# Patient Record
Sex: Male | Born: 1975 | Race: White | Hispanic: No | Marital: Single | Smoking: Never smoker
Health system: Southern US, Community
[De-identification: ages and names within clinical notes are randomized; demographics above are authoritative.]

## PROBLEM LIST (undated history)

## (undated) DIAGNOSIS — F419 Anxiety disorder, unspecified: Secondary | ICD-10-CM

## (undated) DIAGNOSIS — E669 Obesity, unspecified: Secondary | ICD-10-CM

## (undated) DIAGNOSIS — R002 Palpitations: Secondary | ICD-10-CM

## (undated) DIAGNOSIS — E559 Vitamin D deficiency, unspecified: Secondary | ICD-10-CM

## (undated) DIAGNOSIS — E785 Hyperlipidemia, unspecified: Secondary | ICD-10-CM

## (undated) DIAGNOSIS — D751 Secondary polycythemia: Secondary | ICD-10-CM

## (undated) HISTORY — DX: Secondary polycythemia: D75.1

## (undated) HISTORY — DX: Palpitations: R00.2

## (undated) HISTORY — DX: Hyperlipidemia, unspecified: E78.5

## (undated) HISTORY — DX: Vitamin D deficiency, unspecified: E55.9

## (undated) HISTORY — DX: Obesity, unspecified: E66.9

---

## 2001-01-09 ENCOUNTER — Emergency Department (HOSPITAL_COMMUNITY): Admission: EM | Admit: 2001-01-09 | Discharge: 2001-01-09 | Payer: Self-pay | Admitting: Emergency Medicine

## 2001-01-09 ENCOUNTER — Encounter: Payer: Self-pay | Admitting: Emergency Medicine

## 2010-12-31 ENCOUNTER — Emergency Department (HOSPITAL_COMMUNITY): Payer: Self-pay

## 2010-12-31 ENCOUNTER — Emergency Department (HOSPITAL_COMMUNITY)
Admission: EM | Admit: 2010-12-31 | Discharge: 2010-12-31 | Disposition: A | Discharge status: Home / Self Care | Payer: Self-pay | Attending: Emergency Medicine | Admitting: Emergency Medicine

## 2010-12-31 DIAGNOSIS — R5383 Other fatigue: Secondary | ICD-10-CM | POA: Insufficient documentation

## 2010-12-31 DIAGNOSIS — F411 Generalized anxiety disorder: Secondary | ICD-10-CM | POA: Insufficient documentation

## 2010-12-31 DIAGNOSIS — R209 Unspecified disturbances of skin sensation: Secondary | ICD-10-CM | POA: Insufficient documentation

## 2010-12-31 DIAGNOSIS — R5381 Other malaise: Secondary | ICD-10-CM | POA: Insufficient documentation

## 2010-12-31 LAB — GLUCOSE, CAPILLARY: Glucose-Capillary: 83 mg/dL (ref 70–99)

## 2011-07-28 DIAGNOSIS — D45 Polycythemia vera: Secondary | ICD-10-CM | POA: Insufficient documentation

## 2011-07-28 DIAGNOSIS — R5381 Other malaise: Secondary | ICD-10-CM | POA: Insufficient documentation

## 2011-07-28 DIAGNOSIS — R209 Unspecified disturbances of skin sensation: Secondary | ICD-10-CM | POA: Insufficient documentation

## 2011-07-28 DIAGNOSIS — R079 Chest pain, unspecified: Secondary | ICD-10-CM | POA: Insufficient documentation

## 2011-07-28 DIAGNOSIS — M79609 Pain in unspecified limb: Secondary | ICD-10-CM | POA: Insufficient documentation

## 2011-07-28 DIAGNOSIS — R6884 Jaw pain: Secondary | ICD-10-CM | POA: Insufficient documentation

## 2011-07-28 DIAGNOSIS — J45909 Unspecified asthma, uncomplicated: Secondary | ICD-10-CM | POA: Insufficient documentation

## 2011-07-28 DIAGNOSIS — M542 Cervicalgia: Secondary | ICD-10-CM | POA: Insufficient documentation

## 2011-07-29 ENCOUNTER — Encounter (HOSPITAL_COMMUNITY): Payer: Self-pay | Admitting: Family Medicine

## 2011-07-29 ENCOUNTER — Emergency Department (HOSPITAL_COMMUNITY): Payer: Self-pay

## 2011-07-29 ENCOUNTER — Emergency Department (HOSPITAL_COMMUNITY)
Admission: EM | Admit: 2011-07-29 | Discharge: 2011-07-29 | Disposition: A | Discharge status: Home / Self Care | Payer: Self-pay | Attending: Emergency Medicine | Admitting: Emergency Medicine

## 2011-07-29 DIAGNOSIS — D751 Secondary polycythemia: Secondary | ICD-10-CM

## 2011-07-29 HISTORY — DX: Anxiety disorder, unspecified: F41.9

## 2011-07-29 LAB — DIFFERENTIAL
Basophils Absolute: 0 10*3/uL (ref 0.0–0.1)
Basophils Relative: 0 % (ref 0–1)
Lymphocytes Relative: 27 % (ref 12–46)
Monocytes Absolute: 0.6 10*3/uL (ref 0.1–1.0)
Neutro Abs: 5.3 10*3/uL (ref 1.7–7.7)
Neutrophils Relative %: 65 % (ref 43–77)

## 2011-07-29 LAB — CBC
HCT: 49 % (ref 39.0–52.0)
Hemoglobin: 17.4 g/dL — ABNORMAL HIGH (ref 13.0–17.0)
RDW: 12.7 % (ref 11.5–15.5)
WBC: 8.3 10*3/uL (ref 4.0–10.5)

## 2011-07-29 LAB — POCT I-STAT, CHEM 8
BUN: 13 mg/dL (ref 6–23)
Calcium, Ion: 1.17 mmol/L (ref 1.12–1.32)
Chloride: 107 mEq/L (ref 96–112)
HCT: 52 % (ref 39.0–52.0)
Potassium: 4 mEq/L (ref 3.5–5.1)

## 2011-07-29 LAB — POCT I-STAT TROPONIN I: Troponin i, poc: 0 ng/mL (ref 0.00–0.08)

## 2011-07-29 NOTE — Discharge Instructions (Signed)
Chest Pain (Nonspecific) It is often hard to give a specific diagnosis for the cause of chest pain. There is always a chance that your pain could be related to something serious, such as a heart attack or a blood clot in the lungs. You need to follow up with your caregiver for further evaluation. CAUSES   Heartburn.   Pneumonia or bronchitis.   Anxiety or stress.   Inflammation around your heart (pericarditis) or lung (pleuritis or pleurisy).   A blood clot in the lung.   A collapsed lung (pneumothorax). It can develop suddenly on its own (spontaneous pneumothorax) or from injury (trauma) to the chest.   Shingles infection (herpes zoster virus).  The chest wall is composed of bones, muscles, and cartilage. Any of these can be the source of the pain.  The bones can be bruised by injury.   The muscles or cartilage can be strained by coughing or overwork.   The cartilage can be affected by inflammation and become sore (costochondritis).  DIAGNOSIS  Lab tests or other studies, such as X-rays, electrocardiography, stress testing, or cardiac imaging, may be needed to find the cause of your pain.  TREATMENT   Treatment depends on what may be causing your chest pain. Treatment may include:   Acid blockers for heartburn.   Anti-inflammatory medicine.   Pain medicine for inflammatory conditions.   Antibiotics if an infection is present.   You may be advised to change lifestyle habits. This includes stopping smoking and avoiding alcohol, caffeine, and chocolate.   You may be advised to keep your head raised (elevated) when sleeping. This reduces the chance of acid going backward from your stomach into your esophagus.   Most of the time, nonspecific chest pain will improve within 2 to 3 days with rest and mild pain medicine.  HOME CARE INSTRUCTIONS   If antibiotics were prescribed, take your antibiotics as directed. Finish them even if you start to feel better.   For the next few  days, avoid physical activities that bring on chest pain. Continue physical activities as directed.   Do not smoke.   Avoid drinking alcohol.   Only take over-the-counter or prescription medicine for pain, discomfort, or fever as directed by your caregiver.   Follow your caregiver's suggestions for further testing if your chest pain does not go away.   Keep any follow-up appointments you made. If you do not go to an appointment, you could develop lasting (chronic) problems with pain. If there is any problem keeping an appointment, you must call to reschedule.  SEEK MEDICAL CARE IF:   You think you are having problems from the medicine you are taking. Read your medicine instructions carefully.   Your chest pain does not go away, even after treatment.   You develop a rash with blisters on your chest.  SEEK IMMEDIATE MEDICAL CARE IF:   You have increased chest pain or pain that spreads to your arm, neck, jaw, back, or abdomen.   You develop shortness of breath, an increasing cough, or you are coughing up blood.   You have severe back or abdominal pain, feel nauseous, or vomit.   You develop severe weakness, fainting, or chills.   You have a fever.  THIS IS AN EMERGENCY. Do not wait to see if the pain will go away. Get medical help at once. Call your local emergency services (911 in [place] Do not drive yourself to the hospital. MAKE SURE YOU:   Understand these instructions.     Will watch your condition.   Will get help right away if you are not doing well or get worse.  Document Released: 12/15/2004 Document Revised: 02/24/2011 Document Reviewed: 10/11/2007 [employer] Patient Information 2012 [employer]. 

## 2011-07-29 NOTE — ED Notes (Signed)
Pt resting comfortably and friend is at bedside no acute distress.  He is worried about his pain butit is better and he is calmer
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
## 2011-07-29 NOTE — ED Notes (Signed)
Patient states that he has had chest pain off and on "for weeks." Reports left arm "feels weird. My tongue is numb, feels weird." Also reports pain into left neck.

## 2020-05-30 ENCOUNTER — Other Ambulatory Visit: Payer: Self-pay

## 2020-05-30 ENCOUNTER — Emergency Department (HOSPITAL_COMMUNITY): Payer: Medicaid Other

## 2020-05-30 ENCOUNTER — Emergency Department (HOSPITAL_COMMUNITY)
Admission: EM | Admit: 2020-05-30 | Discharge: 2020-05-30 | Disposition: A | Discharge status: Home / Self Care | Payer: Medicaid Other | Attending: Emergency Medicine | Admitting: Emergency Medicine

## 2020-05-30 ENCOUNTER — Encounter (HOSPITAL_COMMUNITY): Payer: Self-pay | Admitting: Emergency Medicine

## 2020-05-30 DIAGNOSIS — R1013 Epigastric pain: Secondary | ICD-10-CM | POA: Insufficient documentation

## 2020-05-30 DIAGNOSIS — F419 Anxiety disorder, unspecified: Secondary | ICD-10-CM | POA: Insufficient documentation

## 2020-05-30 DIAGNOSIS — R000 Tachycardia, unspecified: Secondary | ICD-10-CM

## 2020-05-30 DIAGNOSIS — Z7982 Long term (current) use of aspirin: Secondary | ICD-10-CM | POA: Insufficient documentation

## 2020-05-30 DIAGNOSIS — H538 Other visual disturbances: Secondary | ICD-10-CM | POA: Insufficient documentation

## 2020-05-30 DIAGNOSIS — R079 Chest pain, unspecified: Secondary | ICD-10-CM | POA: Insufficient documentation

## 2020-05-30 DIAGNOSIS — R42 Dizziness and giddiness: Secondary | ICD-10-CM

## 2020-05-30 DIAGNOSIS — R200 Anesthesia of skin: Secondary | ICD-10-CM | POA: Insufficient documentation

## 2020-05-30 DIAGNOSIS — J45909 Unspecified asthma, uncomplicated: Secondary | ICD-10-CM | POA: Insufficient documentation

## 2020-05-30 LAB — CBC WITH DIFFERENTIAL/PLATELET
Abs Immature Granulocytes: 0.05 10*3/uL (ref 0.00–0.07)
Basophils Absolute: 0 10*3/uL (ref 0.0–0.1)
Basophils Relative: 0 %
Eosinophils Absolute: 0 10*3/uL (ref 0.0–0.5)
Eosinophils Relative: 0 %
HCT: 52.6 % — ABNORMAL HIGH (ref 39.0–52.0)
Hemoglobin: 18.4 g/dL — ABNORMAL HIGH (ref 13.0–17.0)
Immature Granulocytes: 0 %
Lymphocytes Relative: 17 %
Lymphs Abs: 2.2 10*3/uL (ref 0.7–4.0)
MCH: 30 pg (ref 26.0–34.0)
MCHC: 35 g/dL (ref 30.0–36.0)
MCV: 85.8 fL (ref 80.0–100.0)
Monocytes Absolute: 0.6 10*3/uL (ref 0.1–1.0)
Monocytes Relative: 5 %
Neutro Abs: 10.6 10*3/uL — ABNORMAL HIGH (ref 1.7–7.7)
Neutrophils Relative %: 78 %
Platelets: 269 10*3/uL (ref 150–400)
RBC: 6.13 MIL/uL — ABNORMAL HIGH (ref 4.22–5.81)
RDW: 12.2 % (ref 11.5–15.5)
WBC: 13.5 10*3/uL — ABNORMAL HIGH (ref 4.0–10.5)
nRBC: 0 % (ref 0.0–0.2)

## 2020-05-30 LAB — T4, FREE: Free T4: 0.81 ng/dL (ref 0.61–1.12)

## 2020-05-30 LAB — TSH: TSH: 1.09 u[IU]/mL (ref 0.350–4.500)

## 2020-05-30 LAB — COMPREHENSIVE METABOLIC PANEL
ALT: 30 U/L (ref 0–44)
AST: 30 U/L (ref 15–41)
Albumin: 4.2 g/dL (ref 3.5–5.0)
Alkaline Phosphatase: 73 U/L (ref 38–126)
Anion gap: 16 — ABNORMAL HIGH (ref 5–15)
BUN: 11 mg/dL (ref 6–20)
CO2: 15 mmol/L — ABNORMAL LOW (ref 22–32)
Calcium: 9.6 mg/dL (ref 8.9–10.3)
Chloride: 105 mmol/L (ref 98–111)
Creatinine, Ser: 1.05 mg/dL (ref 0.61–1.24)
GFR, Estimated: >60 mL/min (ref >60)
Glucose, Bld: 171 mg/dL — ABNORMAL HIGH (ref 70–99)
Potassium: 3.5 mmol/L (ref 3.5–5.1)
Sodium: 136 mmol/L (ref 135–145)
Total Bilirubin: 0.6 mg/dL (ref 0.3–1.2)
Total Protein: 7.1 g/dL (ref 6.5–8.1)

## 2020-05-30 LAB — TROPONIN I (HIGH SENSITIVITY)
Troponin I (High Sensitivity): 3 ng/L (ref <18)
Troponin I (High Sensitivity): 7 ng/L (ref <18)

## 2020-05-30 LAB — D-DIMER, QUANTITATIVE: D-Dimer, Quant: <0.27 ug/mL-FEU (ref 0.00–0.50)

## 2020-05-30 MED ORDER — MECLIZINE HCL 25 MG PO TABS
12.5000 mg | ORAL_TABLET | Freq: Once | ORAL | Status: AC
  Administered 2020-05-30: 12.5 mg via ORAL
  Filled 2020-05-30: qty 1

## 2020-05-30 MED ORDER — LORAZEPAM 0.5 MG PO TABS
0.5000 mg | ORAL_TABLET | Freq: Once | ORAL | Status: AC
  Administered 2020-05-30: 0.5 mg via ORAL
  Filled 2020-05-30: qty 1

## 2020-05-30 MED ORDER — MECLIZINE HCL 25 MG PO TABS: 25.0000 mg | ORAL_TABLET | Freq: Three times a day (TID) | ORAL | 0 refills | Status: DC | PRN

## 2020-05-30 NOTE — ED Provider Notes (Signed)
[facility] DEPT Provider Note   CSN: [id number] Arrival date & time: 05/30/20  1140     History No chief complaint on file.   Cory Frazier is a 45 y.o. male.  HPI 45 year old male with a history of anxiety and asthma presents to the ER with multiple complaints.  He states that he has been having dizziness for "quite a while", and he thinks that he may have vertigo.  He has had a longstanding issue with anxiety, states that his grandmother passed away recently and thinks that this may be contributing to his symptoms.  He states that this morning he got up when he was dizzy while he was laying in bed, got up to drink some orange juice and started to feel some right-sided arm tingling and chest pain.  He was afraid that he was having a heart attack or stroke.  He denies any word slurring, though does endorse occasional blurry vision.  He denies any headache.  He also complains of longstanding epigastric pain, states that he has a very poor diet.  He does not follow with a primary care doctor, states he has not seen a doctor since 2008.  He states that he also thinks that his blood pressures may be elevated which could be causing his symptoms as well.  When asked to identify his most significant concerns, he reports that he was afraid that he was either having a heart attack or stroke.  He does not take any anxiety medicines.  He endorses some chest pain currently, though again does not know if this is due to his anxiety or other causes.  He denies any nausea, vomiting, syncope, back pain or dysuria, hematuria    Past Medical History:  Diagnosis Date   Anxiety    Asthma     There are no problems to display for this patient.   History reviewed. No pertinent surgical history.     No family history on file.  Social History   Tobacco Use   Smoking status: Never Smoker  Substance Use Topics   Alcohol use: Yes    Comment: Occasional    Drug use: No     Home Medications Prior to Admission medications   Medication Sig Start Date End Date Taking? Authorizing Provider  meclizine (ANTIVERT) 25 MG tablet Take 1 tablet (25 mg total) by mouth 3 (three) times daily as needed for dizziness. Start taking 1/2 pill (12.5mg ) up to 3 times daily, increase to taking 25mg  if needed 05/30/20  Yes Aracelly Tencza, Pamelia Hoit, PA-C  ALPRAZolam Duanne Moron) 0.5 MG tablet Take 0.25 mg by mouth 2 (two) times daily.    [provider]  aspirin 81 MG chewable tablet Chew 81 mg by mouth daily.    [provider]    Allergies    Codeine and Claritin [loratadine]  Review of Systems   Review of Systems  Constitutional: Negative for chills and fever.  HENT: Negative for ear pain and sore throat.   Eyes: Negative for pain and visual disturbance.  Respiratory: Positive for shortness of breath. Negative for cough.   Cardiovascular: Positive for chest pain and palpitations.  Gastrointestinal: Positive for abdominal pain. Negative for vomiting.  Genitourinary: Negative for dysuria and hematuria.  Musculoskeletal: Negative for arthralgias and back pain.  Skin: Negative for color change and rash.  Neurological: Positive for dizziness. Negative for seizures, syncope, facial asymmetry, weakness, light-headedness and headaches.  Psychiatric/Behavioral: The patient is nervous/anxious.   All other systems reviewed  and are negative.   Physical Exam Updated Vital Signs BP (!) 136/101    Pulse (!) 106    Temp 97.6 F (36.4 C) (Oral)    Resp 18    Ht 5\' 7"  (1.702 m)    Wt 111.1 kg    SpO2 91%    BMI 38.37 kg/m   Physical Exam Vitals and nursing note reviewed.  Constitutional:      General: He is not in acute distress.    Appearance: He is well-developed. He is not toxic-appearing or diaphoretic.  HENT:     Head: Normocephalic and atraumatic.     Mouth/Throat:     Pharynx: Oropharynx is clear.  Eyes:     Conjunctiva/sclera: Conjunctivae normal.  Cardiovascular:      Rate and Rhythm: Regular rhythm. Tachycardia present.     Pulses: Normal pulses.     Heart sounds: No murmur heard.   Pulmonary:     Effort: Pulmonary effort is normal. No respiratory distress.     Breath sounds: Normal breath sounds.  Abdominal:     General: Abdomen is flat.     Palpations: Abdomen is soft.     Tenderness: There is no abdominal tenderness. There is no right CVA tenderness or left CVA tenderness.  Musculoskeletal:        General: Normal range of motion.     Cervical back: Neck supple.     Right lower leg: No edema.     Left lower leg: No edema.  Skin:    General: Skin is warm and dry.  Neurological:     General: No focal deficit present.     Mental Status: He is alert and oriented to person, place, and time.     Sensory: No sensory deficit.     Motor: No weakness.     Comments: Mental Status:  Alert, thought content appropriate, able to give a coherent history. Speech fluent without evidence of aphasia. Able to follow 2 step commands without difficulty.  Cranial Nerves:  II: Peripheral visual fields grossly normal, pupils equal, round, reactive to light III,IV, VI: ptosis not present, extra-ocular motions intact bilaterally  V,VII: smile symmetric, facial light touch sensation equal VIII: hearing grossly normal to voice  X: uvula elevates symmetrically  XI: bilateral shoulder shrug symmetric and strong XII: midline tongue extension without fassiculations Motor:  Normal tone. 5/5 strength of BUE and BLE major muscle groups including strong and equal grip strength and dorsiflexion/plantar flexion Sensory: light touch normal in all extremities. Cerebellar: normal finger-to-nose with bilateral upper extremities, Romberg sign absent Gait: normal gait and balance. Able to walk on toes and heels with ease.       ED Results / Procedures / Treatments   Labs (all labs ordered are listed, but only abnormal results are displayed) Labs Reviewed  CBC WITH  DIFFERENTIAL/PLATELET - Abnormal; Notable for the following components:      Result Value   WBC 13.5 (*)    RBC 6.13 (*)    Hemoglobin 18.4 (*)    HCT 52.6 (*)    Neutro Abs 10.6 (*)    All other components within normal limits  COMPREHENSIVE METABOLIC PANEL - Abnormal; Notable for the following components:   CO2 15 (*)    Glucose, Bld 171 (*)    Anion gap 16 (*)    All other components within normal limits  TSH  T4, FREE  D-DIMER, QUANTITATIVE  TROPONIN I (HIGH SENSITIVITY)  TROPONIN I (HIGH SENSITIVITY)  EKG EKG Interpretation  Date/Time:  Saturday May 30 2020 11:58:07 EST Ventricular Rate:  112 PR Interval:    QRS Duration: 89 QT Interval:  340 QTC Calculation: 465 R Axis:   -19 Text Interpretation: Sinus tachycardia Borderline left axis deviation Abnormal R-wave progression, late transition Minimal ST depression, lateral leads 12 Lead; Mason-Likar No significant change since last tracing Confirmed by Dorie Rank [phone]) on 05/30/2020 12:00:23 PM   Radiology CT Head Wo Contrast  Result Date: 05/30/2020 CLINICAL DATA:  Dizziness, hypertension EXAM: CT HEAD WITHOUT CONTRAST TECHNIQUE: Contiguous axial images were obtained from the base of the skull through the vertex without intravenous contrast. COMPARISON:  None. FINDINGS: Brain: No evidence of acute infarction, hemorrhage, hydrocephalus, extra-axial collection or mass lesion/mass effect. Vascular: No hyperdense vessel or unexpected calcification. Skull: Normal. Negative for fracture or focal lesion. Sinuses/Orbits: No acute finding. Other: None. IMPRESSION: No acute intracranial pathology. No non-contrast CT findings to explain dizziness. Electronically Signed   By: Eddie Candle M.D.   On: 05/30/2020 12:41   DG Chest Portable 1 View  Result Date: 05/30/2020 CLINICAL DATA:  Chest pain EXAM: PORTABLE CHEST 1 VIEW COMPARISON:  07/29/2011 FINDINGS: The heart size and mediastinal contours are within normal limits. Mildly  prominent perihilar and bibasilar interstitial markings. No focal airspace consolidation. No pleural effusion or pneumothorax. The visualized skeletal structures are unremarkable. IMPRESSION: Mildly prominent perihilar and bibasilar interstitial markings, nonspecific, but could reflect bronchitic type lung changes versus mild edema or atypical/viral infection. No focal airspace consolidation. Electronically Signed   By: Davina Poke D.O.   On: 05/30/2020 12:51    Procedures Procedures   Medications Ordered in ED Medications  LORazepam (ATIVAN) tablet 0.5 mg (0.5 mg Oral Given 05/30/20 1243)  LORazepam (ATIVAN) tablet 0.5 mg (0.5 mg Oral Given 05/30/20 1555)  meclizine (ANTIVERT) tablet 12.5 mg (12.5 mg Oral Given 05/30/20 1555)    ED Course  I have reviewed the triage vital signs and the nursing notes.  Pertinent labs & imaging results that were available during my care of the patient were reviewed by me and considered in my medical decision making (see chart for details).    MDM Rules/Calculators/A&P                          45 year old male with multiple complaints, most notably with dizziness, right arm tingling, chest pain.  Dizziness has been ongoing for quite some time.  On arrival, patient is very anxious appearing, tearful, hyperventilating.  Blood pressure on arrival was 154/113, tachycardic at 125, tachypneic at 22.  However his blood pressure steadily decreased throughout his ED stay, as well as his heart rate.  Physical exam benign, lung sounds clear, no focal neuro deficits noted.  Plan for basic labs, TSH, troponin, chest x-ray, CT of the head.  He was also given 0.5 mg of Ativan.  EKG reviewed by supervising physician Dr. Tomi Bamberger, overall faster rate but no significant changes.  CMP without any significant lecture light abnormalities, normal renal function, his CO2 is 15 and he does have a mildly elevated anion gap, I suspect this is secondary due to his hyperventilation and  anxiety which he presented with to the ER.  He has a nonspecific white count of 13.5, hemoglobin 18.4, chest x-ray with questionable scarring versus infection, but the patient has no respiratory symptoms, no cough, no fevers or chills.  Delta troponins are negative, TSH and free T4 are normal.  D-dimer is negative.  CT of the head without any acute abnormalities.  Patient was given 1 mg of Ativan in total, as well as meclizine.  He remained tachycardic throughout the ED course, however low suspicion for PE given negative D-dimer, no evidence of sepsis at this time.  Orthostatic vitals are overall reassuring.  I suspect significant anxiety as the patient still appears anxious while in the ER.  Suspect possible vertigo as a cause of his dizziness, as has been ongoing for multiple months.  Low suspicion for stroke at this time given persistent symptoms and no other focal neuro deficits.  Patient continued to complain of dizziness and anxiety, I did explain the overall reassuring work-up here today.  I did stress that he needs to follow-up with the PCP, will give contact information for [facility].  Will send home with meclizine.  We discussed return precautions.  He voiced understanding and is agreeable.  Stable for discharge at this time.   Case discussed with Dr. Joya Gaskins who is agreeable to the above plan and disposition   Final Clinical Impression(s) / ED Diagnoses Final diagnoses:  Sinus tachycardia  Dizziness    Rx / DC Orders ED Discharge Orders         Ordered    meclizine (ANTIVERT) 25 MG tablet  3 times daily PRN        05/30/20 1759           Garald Balding, PA-C 05/30/20 1803    Arnaldo Natal, MD 05/30/20 [phone]

## 2020-05-30 NOTE — ED Triage Notes (Signed)
BIB EMS, patient complains of anxiety, dizziness and high blood pressure intermittently over the past week. States he has been under a lot of stress since his grandmother passed away, is tachycardic and tachypnic in triage, is tearful as well.

## 2020-05-30 NOTE — ED Notes (Signed)
Patient was able to ambulate in room. Steady gait. C/o feeling "weird"

## 2020-05-30 NOTE — Discharge Instructions (Signed)
Your work-up today was overall reassuring.  You need to make sure you follow-up with [facility] which is a free clinic here in the area.  If you do have Medicaid, you may call them and ask for a list of approved primary care doctors.  Please return to the ER for any new or worsening symptoms.

## 2020-06-15 ENCOUNTER — Ambulatory Visit (INDEPENDENT_AMBULATORY_CARE_PROVIDER_SITE_OTHER): Payer: Self-pay | Admitting: Family

## 2020-06-15 ENCOUNTER — Encounter: Payer: Self-pay | Admitting: Family

## 2020-06-15 ENCOUNTER — Other Ambulatory Visit: Payer: Self-pay

## 2020-06-15 VITALS — BP 135/96 | HR 79 | Ht 66.97 in | Wt 232.2 lb

## 2020-06-15 DIAGNOSIS — F419 Anxiety disorder, unspecified: Secondary | ICD-10-CM

## 2020-06-15 DIAGNOSIS — R000 Tachycardia, unspecified: Secondary | ICD-10-CM

## 2020-06-15 DIAGNOSIS — F32A Depression, unspecified: Secondary | ICD-10-CM

## 2020-06-15 DIAGNOSIS — Z7689 Persons encountering health services in other specified circumstances: Secondary | ICD-10-CM

## 2020-06-15 DIAGNOSIS — R42 Dizziness and giddiness: Secondary | ICD-10-CM

## 2020-06-15 DIAGNOSIS — Z09 Encounter for follow-up examination after completed treatment for conditions other than malignant neoplasm: Secondary | ICD-10-CM
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
## 2020-06-15 NOTE — Patient Instructions (Addendum)
Return for annual physical examination, labs, and health maintenance. Arrive fasting meaning having had no food and/or nothing to drink for at least 8 hours prior to appointment.  Please take scheduled medications as normal.  Referral to ENT for dizziness.  Thank you for choosing Primary Care at [facility] for your medical home!    Cory Frazier was seen by Camillia Herter, NP today.   Cory Frazier's primary care provider is Ronnald Shedden Zachery Dauer, NP.   For the best care possible,  you should try to see Durene Fruits, NP whenever you come to clinic.   We look forward to seeing you again soon!  If you have any questions about your visit today,  please call us at [phone]  Or feel free to reach your provider via Power.    Hypertension, Adult Hypertension is another name for high blood pressure. High blood pressure forces your heart to work harder to pump blood. This can cause problems over time. There are two numbers in a blood pressure reading. There is a top number (systolic) over a bottom number (diastolic). It is best to have a blood pressure that is below 120/80. Healthy choices can help lower your blood pressure, or you may need medicine to help lower it. What are the causes? The cause of this condition is not known. Some conditions may be related to high blood pressure. What increases the risk?  Smoking.  Having type 2 diabetes mellitus, high cholesterol, or both.  Not getting enough exercise or physical activity.  Being overweight.  Having too much fat, sugar, calories, or salt (sodium) in your diet.  Drinking too much alcohol.  Having long-term (chronic) kidney disease.  Having a family history of high blood pressure.  Age. Risk increases with age.  Race. You may be at higher risk if you are African American.  Gender. Men are at higher risk than women before age 96. After age 30, women are at higher risk than men.  Having obstructive sleep  apnea.  Stress. What are the signs or symptoms?  High blood pressure may not cause symptoms. Very high blood pressure (hypertensive crisis) may cause: ? Headache. ? Feelings of worry or nervousness (anxiety). ? Shortness of breath. ? Nosebleed. ? A feeling of being sick to your stomach (nausea). ? Throwing up (vomiting). ? Changes in how you see. ? Very bad chest pain. ? Seizures. How is this treated?  This condition is treated by making healthy lifestyle changes, such as: ? Eating healthy foods. ? Exercising more. ? Drinking less alcohol.  Your health care provider may prescribe medicine if lifestyle changes are not enough to get your blood pressure under control, and if: ? Your top number is above 130. ? Your bottom number is above 80.  Your personal target blood pressure may vary. Follow these instructions at home: Eating and drinking  If told, follow the DASH eating plan. To follow this plan: ? Fill one half of your plate at each meal with fruits and vegetables. ? Fill one fourth of your plate at each meal with whole grains. Whole grains include whole-wheat pasta, brown rice, and whole-grain bread. ? Eat or drink low-fat dairy products, such as skim milk or low-fat yogurt. ? Fill one fourth of your plate at each meal with low-fat (lean) proteins. Low-fat proteins include fish, chicken without skin, eggs, beans, and tofu. ? Avoid fatty meat, cured and processed meat, or chicken with skin. ? Avoid pre-made or processed food.  Eat  less than 1,500 mg of salt each day.  Do not drink alcohol if: ? Your doctor tells you not to drink. ? You are pregnant, may be pregnant, or are planning to become pregnant.  If you drink alcohol: ? Limit how much you use to:  0-1 drink a day for women.  0-2 drinks a day for men. ? Be aware of how much alcohol is in your drink. In the [place] one drink equals one 12 oz bottle of beer (355 mL), one 5 oz glass of wine (148 mL), or one 1 oz  glass of hard liquor (44 mL).   Lifestyle  Work with your doctor to stay at a healthy weight or to lose weight. Ask your doctor what the best weight is for you.  Get at least 30 minutes of exercise most days of the week. This may include walking, swimming, or biking.  Get at least 30 minutes of exercise that strengthens your muscles (resistance exercise) at least 3 days a week. This may include lifting weights or doing Pilates.  Do not use any products that contain nicotine or tobacco, such as cigarettes, e-cigarettes, and chewing tobacco. If you need help quitting, ask your doctor.  Check your blood pressure at home as told by your doctor.  Keep all follow-up visits as told by your doctor. This is important.   Medicines  Take over-the-counter and prescription medicines only as told by your doctor. Follow directions carefully.  Do not skip doses of blood pressure medicine. The medicine does not work as well if you skip doses. Skipping doses also puts you at risk for problems.  Ask your doctor about side effects or reactions to medicines that you should watch for. Contact a doctor if you:  Think you are having a reaction to the medicine you are taking.  Have headaches that keep coming back (recurring).  Feel dizzy.  Have swelling in your ankles.  Have trouble with your vision. Get help right away if you:  Get a very bad headache.  Start to feel mixed up (confused).  Feel weak or numb.  Feel faint.  Have very bad pain in your: ? Chest. ? Belly (abdomen).  Throw up more than once.  Have trouble breathing. Summary  Hypertension is another name for high blood pressure.  High blood pressure forces your heart to work harder to pump blood.  For most people, a normal blood pressure is less than 120/80.  Making healthy choices can help lower blood pressure. If your blood pressure does not get lower with healthy choices, you may need to take medicine. This information  is not intended to replace advice given to you by your health care provider. Make sure you discuss any questions you have with your health care provider. Document Revised: 11/15/2017 Document Reviewed: 11/15/2017 Elsevier Patient Education  2021 [employer].

## 2020-06-15 NOTE — Progress Notes (Signed)
Establish care Anxiety  BP concerns

## 2020-06-28 NOTE — Progress Notes (Signed)
Patient ID: Cory Frazier, male    DOB: 13-Jan-1976  MRN: [id number]  CC: Annual Physical Examination   Subjective: Cory Frazier is a 45 y.o. male who presents for annual physical examination.  His concerns today include: requesting PSA screening.    Current Outpatient Medications on File Prior to Visit  Medication Sig Dispense Refill  . aspirin 81 MG chewable tablet Chew 81 mg by mouth daily.     No current facility-administered medications on file prior to visit.    Allergies  Allergen Reactions  . Codeine Other (See Comments)    unknown unknown  . Claritin [Loratadine] Anxiety    Social History   Socioeconomic History  . Marital status: Single    Spouse name: Not on file  . Number of children: Not on file  . Years of education: Not on file  . Highest education level: Not on file  Occupational History  . Not on file  Tobacco Use  . Smoking status: Never Smoker  . Smokeless tobacco: Never Used  Vaping Use  . Vaping Use: Never used  Substance and Sexual Activity  . Alcohol use: Yes    Comment: Occasional   . Drug use: No  . Sexual activity: Not Currently  Other Topics Concern  . Not on file  Social History Narrative  . Not on file   Social Determinants of Health   Financial Resource Strain: Not on file  Food Insecurity: Not on file  Transportation Needs: Not on file  Physical Activity: Not on file  Stress: Not on file  Social Connections: Not on file  Intimate Partner Violence: Not on file    Family History  Family history unknown: Yes    No past surgical history on file.  ROS: Review of Systems Negative except as stated above  PHYSICAL EXAM: Pulse 97   Ht 5' 6.97" (1.701 m)   Wt 228 lb (103.4 kg)   SpO2 97%   BMI 35.74 kg/m   Physical Exam Constitutional:      Appearance: He is obese.  HENT:     Head: Normocephalic and atraumatic.     Right Ear: Tympanic membrane, ear canal and external ear normal.     Left Ear: Tympanic  membrane, ear canal and external ear normal.     Nose: Nose normal.     Mouth/Throat:     Mouth: Mucous membranes are moist.     Pharynx: Oropharynx is clear.  Eyes:     Extraocular Movements: Extraocular movements intact.     Conjunctiva/sclera: Conjunctivae normal.     Pupils: Pupils are equal, round, and reactive to light.  Cardiovascular:     Rate and Rhythm: Regular rhythm.     Pulses: Normal pulses.     Heart sounds: Normal heart sounds.  Pulmonary:     Effort: Pulmonary effort is normal.     Breath sounds: Normal breath sounds.  Abdominal:     General: Bowel sounds are normal.     Palpations: Abdomen is soft.  Genitourinary:    Comments: Patient declined examination. Musculoskeletal:        General: Normal range of motion.     Cervical back: Normal range of motion and neck supple.  Skin:    General: Skin is warm and dry.     Capillary Refill: Capillary refill takes less than 2 seconds.  Neurological:     General: No focal deficit present.     Mental Status: He is alert and oriented to  person, place, and time.  Psychiatric:        Mood and Affect: Mood normal.        Behavior: Behavior normal.     ASSESSMENT AND PLAN: 1. Annual physical exam: - Counseled on 150 minutes of exercise per week as tolerated, healthy eating (including decreased daily intake of saturated fats, cholesterol, added sugars, sodium), STI prevention, and routine healthcare maintenance.  2. Encounter for screening for metabolic disorder: - CMP last obtained 05/30/2020.  3. Screening for iron deficiency anemia: - CBC to screen for anemia. - CBC  4. Macrocytic anemia: - Vitamin D and Vitamin B12 screening. - Vitamin D, 25-hydroxy - Vitamin B12  5. Diabetes mellitus screening: - Hemoglobin A1c to screen for pre-diabetes/diabetes. - Hemoglobin A1c  6. Screening cholesterol level: - Lipid panel to screen for high cholesterol.  - Lipid panel  7. Thyroid disorder screen: - TSH last  obtained 05/30/2020.  8. Need for hepatitis C screening test: - Hepatitis C antibody to screen for hepatitis C.  - Hepatitis C Antibody  9. Encounter for screening for HIV: - HIV antibody to screen for human immunodeficiency virus.  - HIV antibody (with reflex)  10. Colon cancer screening: - Referral to Gastroenterology for colon cancer screening by colonoscopy. - Ambulatory referral to Gastroenterology  11. Screening for prostate cancer: - Per patient request PSA screening. - PSA   Patient was given the opportunity to ask questions.  Patient verbalized understanding of the plan and was able to repeat key elements of the plan. Patient was given clear instructions to go to Emergency Department or return to medical center if symptoms don't improve, worsen, or new problems develop.The patient verbalized understanding.   Orders Placed This Encounter  Procedures  . Vitamin D, 25-hydroxy  . Vitamin B12  . Hepatitis C Antibody  . HIV antibody (with reflex)  . Lipid panel  . Hemoglobin A1c  . CBC  . PSA  . Ambulatory referral to Gastroenterology    Follow-up with primary provider as scheduled.  Camillia Herter, NP

## 2020-06-29 ENCOUNTER — Ambulatory Visit (INDEPENDENT_AMBULATORY_CARE_PROVIDER_SITE_OTHER): Payer: Self-pay | Admitting: Family

## 2020-06-29 ENCOUNTER — Other Ambulatory Visit: Payer: Self-pay

## 2020-06-29 VITALS — HR 97 | Ht 66.97 in | Wt 228.0 lb

## 2020-06-29 DIAGNOSIS — D539 Nutritional anemia, unspecified: Secondary | ICD-10-CM

## 2020-06-29 DIAGNOSIS — Z13220 Encounter for screening for lipoid disorders: Secondary | ICD-10-CM

## 2020-06-29 DIAGNOSIS — Z0000 Encounter for general adult medical examination without abnormal findings: Secondary | ICD-10-CM

## 2020-06-29 DIAGNOSIS — Z130 Encounter for screening for diseases of the blood and blood-forming organs and certain disorders involving the immune mechanism: Secondary | ICD-10-CM

## 2020-06-29 DIAGNOSIS — Z1159 Encounter for screening for other viral diseases: Secondary | ICD-10-CM

## 2020-06-29 DIAGNOSIS — Z114 Encounter for screening for human immunodeficiency virus [HIV]: Secondary | ICD-10-CM

## 2020-06-29 DIAGNOSIS — Z131 Encounter for screening for diabetes mellitus: Secondary | ICD-10-CM

## 2020-06-29 DIAGNOSIS — Z1211 Encounter for screening for malignant neoplasm of colon: Secondary | ICD-10-CM

## 2020-06-29 DIAGNOSIS — Z1329 Encounter for screening for other suspected endocrine disorder: Secondary | ICD-10-CM

## 2020-06-29 DIAGNOSIS — E559 Vitamin D deficiency, unspecified: Secondary | ICD-10-CM

## 2020-06-29 DIAGNOSIS — Z125 Encounter for screening for malignant neoplasm of prostate: Secondary | ICD-10-CM

## 2020-06-29 DIAGNOSIS — Z13228 Encounter for screening for other metabolic disorders: Secondary | ICD-10-CM

## 2020-06-29 NOTE — Progress Notes (Signed)
Physical

## 2020-06-29 NOTE — Patient Instructions (Signed)
Annual physical exam and labs today.   Follow-up with primary provider as scheduled.  Preventive Care 43-46 Years Old, Male Preventive care refers to lifestyle choices and visits with your health care provider that can promote health and wellness. This includes:  A yearly physical exam. This is also called an annual wellness visit.  Regular dental and eye exams.  Immunizations.  Screening for certain conditions.  Healthy lifestyle choices, such as: ? Eating a healthy diet. ? Getting regular exercise. ? Not using drugs or products that contain nicotine and tobacco. ? Limiting alcohol use. What can I expect for my preventive care visit? Physical exam Your health care provider will check your:  Height and weight. These may be used to calculate your BMI (body mass index). BMI is a measurement that tells if you are at a healthy weight.  Heart rate and blood pressure.  Body temperature.  Skin for abnormal spots. Counseling Your health care provider may ask you questions about your:  Past medical problems.  Family's medical history.  Alcohol, tobacco, and drug use.  Emotional well-being.  Home life and relationship well-being.  Sexual activity.  Diet, exercise, and sleep habits.  Work and work Statistician.  Access to firearms. What immunizations do I need? Vaccines are usually given at various ages, according to a schedule. Your health care provider will recommend vaccines for you based on your age, medical history, and lifestyle or other factors, such as travel or where you work.   What tests do I need? Blood tests  Lipid and cholesterol levels. These may be checked every 5 years, or more often if you are over 77 years old.  Hepatitis C test.  Hepatitis B test. Screening  Lung cancer screening. You may have this screening every year starting at age 4 if you have a 30-pack-year history of smoking and currently smoke or have quit within the past 15  years.  Prostate cancer screening. Recommendations will vary depending on your family history and other risks.  Genital exam to check for testicular cancer or hernias.  Colorectal cancer screening. ? All adults should have this screening starting at age 10 and continuing until age 66. ? Your health care provider may recommend screening at age 51 if you are at increased risk. ? You will have tests every 1-10 years, depending on your results and the type of screening test.  Diabetes screening. ? This is done by checking your blood sugar (glucose) after you have not eaten for a while (fasting). ? You may have this done every 1-3 years.  STD (sexually transmitted disease) testing, if you are at risk. Follow these instructions at home: Eating and drinking  Eat a diet that includes fresh fruits and vegetables, whole grains, lean protein, and low-fat dairy products.  Take vitamin and mineral supplements as recommended by your health care provider.  Do not drink alcohol if your health care provider tells you not to drink.  If you drink alcohol: ? Limit how much you have to 0-2 drinks a day. ? Be aware of how much alcohol is in your drink. In the [place] one drink equals one 12 oz bottle of beer (355 mL), one 5 oz glass of wine (148 mL), or one 1 oz glass of hard liquor (44 mL).   Lifestyle  Take daily care of your teeth and gums. Brush your teeth every morning and night with fluoride toothpaste. Floss one time each day.  Stay active. Exercise for at least 30 minutes  5 or more days each week.  Do not use any products that contain nicotine or tobacco, such as cigarettes, e-cigarettes, and chewing tobacco. If you need help quitting, ask your health care provider.  Do not use drugs.  If you are sexually active, practice safe sex. Use a condom or other form of protection to prevent STIs (sexually transmitted infections).  If told by your health care provider, take low-dose aspirin daily  starting at age 85.  Find healthy ways to cope with stress, such as: ? Meditation, yoga, or listening to music. ? Journaling. ? Talking to a trusted person. ? Spending time with friends and family. Safety  Always wear your seat belt while driving or riding in a vehicle.  Do not drive: ? If you have been drinking alcohol. Do not ride with someone who has been drinking. ? When you are tired or distracted. ? While texting.  Wear a helmet and other protective equipment during sports activities.  If you have firearms in your house, make sure you follow all gun safety procedures. What's next?  Go to your health care provider once a year for an annual wellness visit.  Ask your health care provider how often you should have your eyes and teeth checked.  Stay up to date on all vaccines. This information is not intended to replace advice given to you by your health care provider. Make sure you discuss any questions you have with your health care provider. Document Revised: 12/04/2018 Document Reviewed: 03/01/2018 Elsevier Patient Education  2021 [employer].

## 2020-06-30 DIAGNOSIS — E559 Vitamin D deficiency, unspecified: Secondary | ICD-10-CM | POA: Insufficient documentation

## 2020-06-30 LAB — CBC
Hematocrit: 53.2 % — ABNORMAL HIGH (ref 37.5–51.0)
Hemoglobin: 18.2 g/dL — ABNORMAL HIGH (ref 13.0–17.7)
MCH: 29.6 pg (ref 26.6–33.0)
MCHC: 34.2 g/dL (ref 31.5–35.7)
MCV: 87 fL (ref 79–97)
Platelets: 231 10*3/uL (ref 150–450)
RBC: 6.15 x10E6/uL — ABNORMAL HIGH (ref 4.14–5.80)
RDW: 12.5 % (ref 11.6–15.4)
WBC: 6.6 10*3/uL (ref 3.4–10.8)

## 2020-06-30 LAB — PSA: Prostate Specific Ag, Serum: 1 ng/mL (ref 0.0–4.0)

## 2020-06-30 LAB — HEMOGLOBIN A1C
Est. average glucose Bld gHb Est-mCnc: 100 mg/dL
Hgb A1c MFr Bld: 5.1 % (ref 4.8–5.6)

## 2020-06-30 LAB — LIPID PANEL
Chol/HDL Ratio: 5.1 ratio — ABNORMAL HIGH (ref 0.0–5.0)
Cholesterol, Total: 223 mg/dL — ABNORMAL HIGH (ref 100–199)
HDL: 44 mg/dL (ref >39)
LDL Chol Calc (NIH): 141 mg/dL — ABNORMAL HIGH (ref 0–99)
Triglycerides: 211 mg/dL — ABNORMAL HIGH (ref 0–149)
VLDL Cholesterol Cal: 38 mg/dL (ref 5–40)

## 2020-06-30 LAB — HIV ANTIBODY (ROUTINE TESTING W REFLEX): HIV Screen 4th Generation wRfx: NONREACTIVE

## 2020-06-30 LAB — HEPATITIS C ANTIBODY: Hep C Virus Ab: <0.1 s/co ratio (ref 0.0–0.9)

## 2020-06-30 LAB — VITAMIN D 25 HYDROXY (VIT D DEFICIENCY, FRACTURES): Vit D, 25-Hydroxy: 11.9 ng/mL — ABNORMAL LOW (ref 30.0–100.0)

## 2020-06-30 LAB — VITAMIN B12: Vitamin B-12: 410 pg/mL (ref 232–1245)

## 2020-06-30 MED ORDER — CHOLECALCIFEROL 1.25 MG (50000 UT) PO TABS: 1.0000 | ORAL_TABLET | ORAL | 0 refills | Status: DC

## 2020-06-30 NOTE — Progress Notes (Signed)
No diabetes.   Prostate function normal.   Hepatitis C negative.   HIV negative.   Vitamin B12 normal.   Vitamin D low. A Vitamin D pill prescribed and sent to patients pharmacy. Take 1 pill per week for the next 12 weeks. Encouraged to have rechecked in 12 weeks.   Cholesterol higher than expected. High cholesterol may increase risk of heart attack and/or stroke. Consider eating more fruits, vegetables, and lean baked meats such as chicken or fish. Moderate intensity exercise at least 150 minutes as tolerated per week may help as well.  Encouraged to have rechecked in 6 months or sooner if needed.   The following is for provider reference only: The 10-year ASCVD risk score is: 3.5%   Values used to calculate the score:     Age: 45 years     Sex: Male     Is Non-Hispanic African American: No     Diabetic: No     Tobacco smoker: No     Systolic Blood Pressure: 330 mmHg     Is BP treated: No     HDL Cholesterol: 44 mg/dL     Total Cholesterol: 223 mg/dL

## 2020-06-30 NOTE — Addendum Note (Signed)
Addended by: Camillia Herter on: 06/30/2020 07:33 AM   Modules accepted: Orders

## 2020-07-08 ENCOUNTER — Telehealth: Payer: Self-pay | Admitting: Family

## 2020-07-08 NOTE — Telephone Encounter (Signed)
Pt called because he has questions/ concerns about Cholecalciferol 1.25 MG (50000 UT) TABS [[id number]]. He would like to talk with a nurse regarding this med. His cell is [phone].

## 2020-07-22 ENCOUNTER — Telehealth: Payer: Self-pay | Admitting: Family

## 2020-07-22 NOTE — Telephone Encounter (Signed)
Pt calling stating he has anxiety issues and was wondering if something could be prescribed. Pt stated when he was at his visit he was not ready to start the medication and now is feeling like he needs to do something. Pt is asking for a low dose.     Pharmacy  [facility] [place]  [place], [place] [address]  Phone:  [phone] Fax:  [phone]  DEA #:  [id number]  \

## 2020-07-23 NOTE — Telephone Encounter (Signed)
Appt sch'd 07/27/20

## 2020-07-26 NOTE — Progress Notes (Signed)
Virtual Visit via Telephone Note  I connected with Cory Frazier, on 07/27/2020 at 10:36 AM by telephone due to the COVID-19 pandemic and verified that I am speaking with the correct person using two identifiers.  Due to current restrictions/limitations of in-office visits due to the COVID-19 pandemic, this scheduled clinical appointment was converted to a telehealth visit.   Consent: I discussed the limitations, risks, security and privacy concerns of performing an evaluation and management service by telephone and the availability of in person appointments. I also discussed with the patient that there may be a patient responsible charge related to this service. The patient expressed understanding and agreed to proceed.  Location of Patient: Home  Location of Provider: [facility] [place] participating in Telemedicine visit: Cory Frazier Durene Fruits, NP Elmon Else, CMA  History of Present Illness: Cory Frazier is a 45 year-old male who presents for anxiety and depression follow-up.  1. ANXIETY AND DEPRESSION FOLLOW-UP: 06/15/2020: - Stable. - Denies thoughts of self-harm, suicidal ideations, and homicidal ideations  - Declined pharmacological therapy during today's visit.  - [facility] referral for counseling services.  - Follow-up with primary provider as scheduled.   07/27/2020: Reports anxiety is worsening. Affecting relationships with family and friends. He would like to try medication to see if this helps. Not ready for counseling services as of yet. Denies thoughts of self-harm, suicidal ideations, and homicidal ideations.    2. GERD: Heartburn frequency: Yes, intermittently. Reports heartburn causes anxiety to worsen Antacid use frequency:  Using over-the-counter Pepcid. However, planning to switch to over-the-counter Nexium. Not ready for prescribed medication. Reports he read things online about Omeprazole. Aggravating  factors: caffeine Dysphagia: no Odynophagia:  no Hematemesis: no Blood in stool: no  3. VITAMIN D FOLLOW-UP: Patient reports not taking prescribed Vitamin D. Reports read online the side effects of prescribed Vitamin D at the dosage ordered. Would like to continue over-the-counter Vitamin D.   Past Medical History:  Diagnosis Date  . Anxiety   . Asthma    Allergies  Allergen Reactions  . Codeine Other (See Comments)    unknown unknown  . Claritin [Loratadine] Anxiety    Current Outpatient Medications on File Prior to Visit  Medication Sig Dispense Refill  . aspirin 81 MG chewable tablet Chew 81 mg by mouth daily.    . Cholecalciferol 1.25 MG (50000 UT) TABS Take 1 tablet by mouth once a week. 12 tablet 0   No current facility-administered medications on file prior to visit.    Observations/Objective: Alert and oriented x 3. Not in acute distress. Physical examination not completed as this is a telemedicine visit.  Assessment and Plan: 1. Anxiety and depression: - Stable.  - Denies thoughts of self-harm, suicidal ideations, and homicidal ideations.  - Begin Sertraline as prescribed.   Avoid driving or hazardous activity until you know how this medication will affect you. Your reactions could be impaired. Dizziness or fainting can cause falls, accidents, or severe injuries.  Common side effects include drowsiness, nausea, constipation, loss of appetite, dry mouth, increased sweating.  Call your provider if you have pounding heartbeats or fluttering in your chest, a light-headed feeling like you may pass out, easy bruising/unusal bleeding, vision change, difficult or painful urination, impotence/sexual problems, liver problems (right-sided upper stomach pain, itching, dark urine, yellowing of skin or eyes/jaundice, low levels of sodium in the body (headache, confusion, slurred speech, severe weakness, vomiting, loss of coordination, feeling unsteady), or manic episodes (racing  thoughts, increased energy, decreased need for sleep, risk-taking behavior, being agitated, talkative)  Seek medical attention immediately if you have symptoms of serotonin syndrome such as agitation, hallucinations, fever, sweating, shivering, fast heart rate, muscle stiffness, twitching, loss of coordination, nausea, vomiting, or diarrhea  Report any new or worsening symptoms to your provider, such as but not limited to: mood or behavior changes, anxiety, panic attacks, trouble sleeping, or if you feel impulsive, irritable, agitated, hostile, aggressive, restless, hyperactive (mentally or physically), more depressed, or have thoughts about suicide or hurting yourself - Follow-up with primary provider in 4 weeks or sooner if needed.  - sertraline (ZOLOFT) 25 MG tablet; Take 1 tablet (25 mg total) by mouth daily.  Dispense: 30 tablet; Refill: 0  2. Gastroesophageal reflux disease, unspecified whether esophagitis present: - Patient preference to continue over-the-counter acid reflux medication.   3. Vitamin D deficiency: - Patient preference to continue over-the-counter vitamin D medication.   Follow Up Instructions: Follow-up with primary provider in 4 weeks or sooner if needed.    Patient was given clear instructions to go to Emergency Department or return to medical center if symptoms don't improve, worsen, or new problems develop.The patient verbalized understanding.  I discussed the assessment and treatment plan with the patient. The patient was provided an opportunity to ask questions and all were answered. The patient agreed with the plan and demonstrated an understanding of the instructions.   The patient was advised to call back or seek an in-person evaluation if the symptoms worsen or if the condition fails to improve as anticipated.   I provided 20 minutes total of non-face-to-face time during this encounter.   Camillia Herter, NP  [facility] [place], [place] 07/27/2020, 10:36 AM

## 2020-07-27 ENCOUNTER — Telehealth (INDEPENDENT_AMBULATORY_CARE_PROVIDER_SITE_OTHER): Payer: Self-pay | Admitting: Family

## 2020-07-27 ENCOUNTER — Other Ambulatory Visit: Payer: Self-pay

## 2020-07-27 DIAGNOSIS — F32A Depression, unspecified: Secondary | ICD-10-CM

## 2020-07-27 DIAGNOSIS — E559 Vitamin D deficiency, unspecified: Secondary | ICD-10-CM

## 2020-07-27 DIAGNOSIS — K219 Gastro-esophageal reflux disease without esophagitis: Secondary | ICD-10-CM

## 2020-07-27 DIAGNOSIS — F419 Anxiety disorder, unspecified: Secondary | ICD-10-CM

## 2020-07-27 MED ORDER — SERTRALINE HCL 25 MG PO TABS: 25.0000 mg | ORAL_TABLET | Freq: Every day | ORAL | 0 refills | Status: DC

## 2020-07-27 NOTE — Progress Notes (Signed)
Anxiety and heartburn Pt states that when he has heartburn it makes him have panic attacks

## 2020-11-03 ENCOUNTER — Ambulatory Visit: Payer: Medicaid Other | Admitting: Family

## 2020-11-03 ENCOUNTER — Ambulatory Visit: Payer: Medicaid Other | Admitting: Family Medicine

## 2020-11-30 ENCOUNTER — Encounter: Payer: Self-pay | Admitting: Family Medicine

## 2020-11-30 ENCOUNTER — Other Ambulatory Visit: Payer: Self-pay

## 2020-11-30 ENCOUNTER — Ambulatory Visit (INDEPENDENT_AMBULATORY_CARE_PROVIDER_SITE_OTHER): Payer: Self-pay | Admitting: Family Medicine

## 2020-11-30 VITALS — BP 163/124 | HR 103 | Temp 98.4°F | Resp 16 | Ht 66.0 in | Wt 230.2 lb

## 2020-11-30 DIAGNOSIS — K219 Gastro-esophageal reflux disease without esophagitis: Secondary | ICD-10-CM

## 2020-11-30 DIAGNOSIS — F32A Depression, unspecified: Secondary | ICD-10-CM

## 2020-11-30 DIAGNOSIS — I10 Essential (primary) hypertension: Secondary | ICD-10-CM

## 2020-11-30 DIAGNOSIS — F419 Anxiety disorder, unspecified: Secondary | ICD-10-CM

## 2020-11-30 MED ORDER — METOPROLOL TARTRATE 25 MG PO TABS
25.0000 mg | ORAL_TABLET | Freq: Two times a day (BID) | ORAL | 0 refills | Status: DC
Start: 2020-11-30 — End: 2020-12-22

## 2020-11-30 NOTE — Progress Notes (Signed)
Established Patient Office Visit  Subjective:  Patient ID: Cory Frazier, male    DOB: 02-Mar-1976  Age: 45 y.o. MRN: [id number]  CC:  Chief Complaint  Patient presents with   Follow-up    Medication update    HPI Cory Frazier presents for follow-up of anxiety.  Patient reports that his anxiety persists however he has not started the medicine secondary to side effects he read on [employer].  He also reports intermittent palpitations.  He is very concerned about his blood pressure.  Past Medical History:  Diagnosis Date   Anxiety    Asthma     No past surgical history on file.  Family History  Family history unknown: Yes    Social History   Socioeconomic History   Marital status: Single    Spouse name: Not on file   Number of children: Not on file   Years of education: Not on file   Highest education level: Not on file  Occupational History   Not on file  Tobacco Use   Smoking status: Never   Smokeless tobacco: Never  Vaping Use   Vaping Use: Never used  Substance and Sexual Activity   Alcohol use: Yes    Comment: Occasional    Drug use: No   Sexual activity: Not Currently  Other Topics Concern   Not on file  Social History Narrative   Not on file   Social Determinants of Health   Financial Resource Strain: Not on file  Food Insecurity: Not on file  Transportation Needs: Not on file  Physical Activity: Not on file  Stress: Not on file  Social Connections: Not on file  Intimate Partner Violence: Not on file    ROS Review of Systems  Gastrointestinal:  Negative for abdominal pain.  Psychiatric/Behavioral:  Negative for self-injury, sleep disturbance and suicidal ideas. The patient is nervous/anxious.   All other systems reviewed and are negative.  Objective:   Today's Vitals: BP (!) 163/124 (BP Location: Right Arm, Patient Position: Sitting, Cuff Size: Large)   Pulse (!) 103   Temp 98.4 F (36.9 C) (Oral)   Resp 16   Ht '5\' 6"'$  (1.676 m)   Wt 230  lb 3.2 oz (104.4 kg)   SpO2 96%   BMI 37.16 kg/m   Physical Exam Vitals and nursing note reviewed.  Constitutional:      General: He is not in acute distress. Cardiovascular:     Rate and Rhythm: Normal rate and regular rhythm.  Pulmonary:     Effort: Pulmonary effort is normal.     Breath sounds: Normal breath sounds.  Abdominal:     Palpations: Abdomen is soft.     Tenderness: There is no abdominal tenderness.  Musculoskeletal:     Right lower leg: No edema.     Left lower leg: No edema.  Neurological:     General: No focal deficit present.     Mental Status: He is alert and oriented to person, place, and time.    Assessment & Plan:   1. Anxiety and depression Discussed compliance - patient to start zoloft 25 mg  2. Essential hypertension Elevated reading - Metoprolol 25 mg prescribed - will monitor  3. Gastroesophageal reflux disease without esophagitis Discussed dietary and activity options.     Outpatient Encounter Medications as of 11/30/2020  Medication Sig   aspirin 81 MG chewable tablet Chew 81 mg by mouth daily.   metoprolol tartrate (LOPRESSOR) 25 MG tablet Take 1 tablet (25  mg total) by mouth 2 (two) times daily.   Cholecalciferol 1.25 MG (50000 UT) TABS Take 1 tablet by mouth once a week.   sertraline (ZOLOFT) 25 MG tablet Take 1 tablet (25 mg total) by mouth daily.   No facility-administered encounter medications on file as of 11/30/2020.    Follow-up: Return in about 4 weeks (around 12/28/2020) for follow up.   Becky Sax, MD

## 2020-11-30 NOTE — Progress Notes (Signed)
Patient concern about taking Vit D. Patient thinks he was getting to much and it was giving him heart palpitation.  Patient has not started taking his sertraline  Because he read about the side effect and was very scared it may cause side effects  Patient said that he stop taking pepcid because it was giving him a panic attack.

## 2020-12-08 ENCOUNTER — Telehealth: Payer: Self-pay | Admitting: Family

## 2020-12-08 NOTE — Telephone Encounter (Signed)
Pt called in stating med has not been working and would like to speak with someone about it pt pcp amy but last seen with wilson on 11/30/20

## 2020-12-10 ENCOUNTER — Telehealth (INDEPENDENT_AMBULATORY_CARE_PROVIDER_SITE_OTHER): Payer: Self-pay | Admitting: Nurse Practitioner

## 2020-12-10 ENCOUNTER — Other Ambulatory Visit: Payer: Self-pay

## 2020-12-10 ENCOUNTER — Encounter: Payer: Self-pay | Admitting: Nurse Practitioner

## 2020-12-10 DIAGNOSIS — I10 Essential (primary) hypertension: Secondary | ICD-10-CM

## 2020-12-10 NOTE — Patient Instructions (Addendum)
Anxiety and depression Has not started zoloft   2. Essential hypertension Elevated reading - increase Metoprolol to 50 mg BID - will monitor   3. Gastroesophageal reflux disease without esophagitis Discussed dietary and activity options  Follow up:  Follow up as scheduled  Managing Your Hypertension Hypertension, also called high blood pressure, is when the force of the blood pressing against the walls of the arteries is too strong. Arteries are blood vessels that carry blood from your heart throughout your body. Hypertension forces the heart to work harder to pump blood and may cause the arteries to become narrow or stiff. Understanding blood pressure readings Your personal target blood pressure may vary depending on your medical conditions, your age, and other factors. A blood pressure reading includes a higher number over a lower number. Ideally, your blood pressure should be below 120/80. You should know that: The first, or top, number is called the systolic pressure. It is a measure of the pressure in your arteries as your heart beats. The second, or bottom number, is called the diastolic pressure. It is a measure of the pressure in your arteries as the heart relaxes. Blood pressure is classified into four stages. Based on your blood pressure reading, your health care provider may use the following stages to determine what type of treatment you need, if any. Systolic pressure and diastolic pressure are measured in a unit called mmHg. Normal Systolic pressure: below 629. Diastolic pressure: below 80. Elevated Systolic pressure: 528-413. Diastolic pressure: below 80. Hypertension stage 1 Systolic pressure: 244-010. Diastolic pressure: 27-25. Hypertension stage 2 Systolic pressure: 366 or above. Diastolic pressure: 90 or above. How can this condition affect me? Managing your hypertension is an important responsibility. Over time, hypertension can damage the arteries and decrease  blood flow to important parts of the body, including the brain, heart, and kidneys. Having untreated or uncontrolled hypertension can lead to: A heart attack. A stroke. A weakened blood vessel (aneurysm). Heart failure. Kidney damage. Eye damage. Metabolic syndrome. Memory and concentration problems. Vascular dementia. What actions can I take to manage this condition? Hypertension can be managed by making lifestyle changes and possibly by taking medicines. Your health care provider will help you make a plan to bring your blood pressure within a normal range. Nutrition  Eat a diet that is high in fiber and potassium, and low in salt (sodium), added sugar, and fat. An example eating plan is called the Dietary Approaches to Stop Hypertension (DASH) diet. To eat this way: Eat plenty of fresh fruits and vegetables. Try to fill one-half of your plate at each meal with fruits and vegetables. Eat whole grains, such as whole-wheat pasta, brown rice, or whole-grain bread. Fill about one-fourth of your plate with whole grains. Eat low-fat dairy products. Avoid fatty cuts of meat, processed or cured meats, and poultry with skin. Fill about one-fourth of your plate with lean proteins such as fish, chicken without skin, beans, eggs, and tofu. Avoid pre-made and processed foods. These tend to be higher in sodium, added sugar, and fat. Reduce your daily sodium intake. Most people with hypertension should eat less than 1,500 mg of sodium a day. Lifestyle  Work with your health care provider to maintain a healthy body weight or to lose weight. Ask what an ideal weight is for you. Get at least 30 minutes of exercise that causes your heart to beat faster (aerobic exercise) most days of the week. Activities may include walking, swimming, or biking. Include exercise to  strengthen your muscles (resistance exercise), such as weight lifting, as part of your weekly exercise routine. Try to do these types of exercises  for 30 minutes at least 3 days a week. Do not use any products that contain nicotine or tobacco, such as cigarettes, e-cigarettes, and chewing tobacco. If you need help quitting, ask your health care provider. Control any long-term (chronic) conditions you have, such as high cholesterol or diabetes. Identify your sources of stress and find ways to manage stress. This may include meditation, deep breathing, or making time for fun activities. Alcohol use Do not drink alcohol if: Your health care provider tells you not to drink. You are pregnant, may be pregnant, or are planning to become pregnant. If you drink alcohol: Limit how much you use to: 0-1 drink a day for women. 0-2 drinks a day for men. Be aware of how much alcohol is in your drink. In the [place] one drink equals one 12 oz bottle of beer (355 mL), one 5 oz glass of wine (148 mL), or one 1 oz glass of hard liquor (44 mL). Medicines Your health care provider may prescribe medicine if lifestyle changes are not enough to get your blood pressure under control and if: Your systolic blood pressure is 130 or higher. Your diastolic blood pressure is 80 or higher. Take medicines only as told by your health care provider. Follow the directions carefully. Blood pressure medicines must be taken as told by your health care provider. The medicine does not work as well when you skip doses. Skipping doses also puts you at risk for problems. Monitoring Before you monitor your blood pressure: Do not smoke, drink caffeinated beverages, or exercise within 30 minutes before taking a measurement. Use the bathroom and empty your bladder (urinate). Sit quietly for at least 5 minutes before taking measurements. Monitor your blood pressure at home as told by your health care provider. To do this: Sit with your back straight and supported. Place your feet flat on the floor. Do not cross your legs. Support your arm on a flat surface, such as a table. Make sure  your upper arm is at heart level. Each time you measure, take two or three readings one minute apart and record the results. You may also need to have your blood pressure checked regularly by your health care provider. General information Talk with your health care provider about your diet, exercise habits, and other lifestyle factors that may be contributing to hypertension. Review all the medicines you take with your health care provider because there may be side effects or interactions. Keep all visits as told by your health care provider. Your health care provider can help you create and adjust your plan for managing your high blood pressure. Where to find more information National Heart, Lung, and Blood Institute: https://wilson-eaton.com/ American Heart Association: www.heart.org Contact a health care provider if: You think you are having a reaction to medicines you have taken. You have repeated (recurrent) headaches. You feel dizzy. You have swelling in your ankles. You have trouble with your vision. Get help right away if: You develop a severe headache or confusion. You have unusual weakness or numbness, or you feel faint. You have severe pain in your chest or abdomen. You vomit repeatedly. You have trouble breathing. These symptoms may represent a serious problem that is an emergency. Do not wait to see if the symptoms will go away. Get medical help right away. Call your local emergency services (911 in the [place]  Do not drive yourself to the hospital. Summary Hypertension is when the force of blood pumping through your arteries is too strong. If this condition is not controlled, it may put you at risk for serious complications. Your personal target blood pressure may vary depending on your medical conditions, your age, and other factors. For most people, a normal blood pressure is less than 120/80. Hypertension is managed by lifestyle changes, medicines, or both. Lifestyle changes to  help manage hypertension include losing weight, eating a healthy, low-sodium diet, exercising more, stopping smoking, and limiting alcohol. This information is not intended to replace advice given to you by your health care provider. Make sure you discuss any questions you have with your health care provider. Document Revised: 04/12/2019 Document Reviewed: 02/05/2019 Elsevier Patient Education  2022 [place] Eating Plan DASH stands for Dietary Approaches to Stop Hypertension. The DASH eating plan is a healthy eating plan that has been shown to: Reduce high blood pressure (hypertension). Reduce your risk for type 2 diabetes, heart disease, and stroke. Help with weight loss. What are tips for following this plan? Reading food labels Check food labels for the amount of salt (sodium) per serving. Choose foods with less than 5 percent of the Daily Value of sodium. Generally, foods with less than 300 milligrams (mg) of sodium per serving fit into this eating plan. To find whole grains, look for the word "whole" as the first word in the ingredient list. Shopping Buy products labeled as "low-sodium" or "no salt added." Buy fresh foods. Avoid canned foods and pre-made or frozen meals. Cooking Avoid adding salt when cooking. Use salt-free seasonings or herbs instead of table salt or sea salt. Check with your health care provider or pharmacist before using salt substitutes. Do not fry foods. Cook foods using healthy methods such as baking, boiling, grilling, roasting, and broiling instead. Cook with heart-healthy oils, such as olive, canola, avocado, soybean, or sunflower oil. Meal planning  Eat a balanced diet that includes: 4 or more servings of fruits and 4 or more servings of vegetables each day. Try to fill one-half of your plate with fruits and vegetables. 6-8 servings of whole grains each day. Less than 6 oz (170 g) of lean meat, poultry, or fish each day. A 3-oz (85-g) serving of  meat is about the same size as a deck of cards. One egg equals 1 oz (28 g). 2-3 servings of low-fat dairy each day. One serving is 1 cup (237 mL). 1 serving of nuts, seeds, or beans 5 times each week. 2-3 servings of heart-healthy fats. Healthy fats called omega-3 fatty acids are found in foods such as walnuts, flaxseeds, fortified milks, and eggs. These fats are also found in cold-water fish, such as sardines, salmon, and mackerel. Limit how much you eat of: Canned or prepackaged foods. Food that is high in trans fat, such as some fried foods. Food that is high in saturated fat, such as fatty meat. Desserts and other sweets, sugary drinks, and other foods with added sugar. Full-fat dairy products. Do not salt foods before eating. Do not eat more than 4 egg yolks a week. Try to eat at least 2 vegetarian meals a week. Eat more home-cooked food and less restaurant, buffet, and fast food. Lifestyle When eating at a restaurant, ask that your food be prepared with less salt or no salt, if possible. If you drink alcohol: Limit how much you use to: 0-1 drink a day for women who are not  pregnant. 0-2 drinks a day for men. Be aware of how much alcohol is in your drink. In the [place] one drink equals one 12 oz bottle of beer (355 mL), one 5 oz glass of wine (148 mL), or one 1 oz glass of hard liquor (44 mL). General information Avoid eating more than 2,300 mg of salt a day. If you have hypertension, you may need to reduce your sodium intake to 1,500 mg a day. Work with your health care provider to maintain a healthy body weight or to lose weight. Ask what an ideal weight is for you. Get at least 30 minutes of exercise that causes your heart to beat faster (aerobic exercise) most days of the week. Activities may include walking, swimming, or biking. Work with your health care provider or dietitian to adjust your eating plan to your individual calorie needs. What foods should I eat? Fruits All fresh,  dried, or frozen fruit. Canned fruit in natural juice (without added sugar). Vegetables Fresh or frozen vegetables (raw, steamed, roasted, or grilled). Low-sodium or reduced-sodium tomato and vegetable juice. Low-sodium or reduced-sodium tomato sauce and tomato paste. Low-sodium or reduced-sodium canned vegetables. Grains Whole-grain or whole-wheat bread. Whole-grain or whole-wheat pasta. Brown rice. [place]. Bulgur. Whole-grain and low-sodium cereals. Pita bread. Low-fat, low-sodium crackers. Whole-wheat flour tortillas. Meats and other proteins Skinless chicken or [place]. Ground chicken or [place]. Pork with fat trimmed off. Fish and seafood. Egg whites. Dried beans, peas, or lentils. Unsalted nuts, nut butters, and seeds. Unsalted canned beans. Lean cuts of beef with fat trimmed off. Low-sodium, lean precooked or cured meat, such as sausages or meat loaves. Dairy Low-fat (1%) or fat-free (skim) milk. Reduced-fat, low-fat, or fat-free cheeses. Nonfat, low-sodium ricotta or cottage cheese. Low-fat or nonfat yogurt. Low-fat, low-sodium cheese. Fats and oils Soft margarine without trans fats. Vegetable oil. Reduced-fat, low-fat, or light mayonnaise and salad dressings (reduced-sodium). Canola, safflower, olive, avocado, soybean, and sunflower oils. Avocado. Seasonings and condiments Herbs. Spices. Seasoning mixes without salt. Other foods Unsalted popcorn and pretzels. Fat-free sweets. The items listed above may not be a complete list of foods and beverages you can eat. Contact a dietitian for more information. What foods should I avoid? Fruits Canned fruit in a light or heavy syrup. Fried fruit. Fruit in cream or butter sauce. Vegetables Creamed or fried vegetables. Vegetables in a cheese sauce. Regular canned vegetables (not low-sodium or reduced-sodium). Regular canned tomato sauce and paste (not low-sodium or reduced-sodium). Regular tomato and vegetable juice (not low-sodium or  reduced-sodium). Angie Fava. Olives. Grains Baked goods made with fat, such as croissants, muffins, or some breads. Dry pasta or rice meal packs. Meats and other proteins Fatty cuts of meat. Ribs. Fried meat. Berniece Salines. Bologna, salami, and other precooked or cured meats, such as sausages or meat loaves. Fat from the back of a pig (fatback). Bratwurst. Salted nuts and seeds. Canned beans with added salt. Canned or smoked fish. Whole eggs or egg yolks. Chicken or [place] with skin. Dairy Whole or 2% milk, cream, and half-and-half. Whole or full-fat cream cheese. Whole-fat or sweetened yogurt. Full-fat cheese. Nondairy creamers. Whipped toppings. Processed cheese and cheese spreads. Fats and oils Butter. Stick margarine. Lard. Shortening. Ghee. Bacon fat. Tropical oils, such as coconut, palm kernel, or palm oil. Seasonings and condiments Onion salt, garlic salt, seasoned salt, table salt, and sea salt. Worcestershire sauce. Tartar sauce. Barbecue sauce. Teriyaki sauce. Soy sauce, including reduced-sodium. Steak sauce. Canned and packaged gravies. Fish sauce. Oyster sauce. Cocktail sauce. Store-bought  horseradish. Ketchup. Mustard. Meat flavorings and tenderizers. Bouillon cubes. Hot sauces. Pre-made or packaged marinades. Pre-made or packaged taco seasonings. Relishes. Regular salad dressings. Other foods Salted popcorn and pretzels. The items listed above may not be a complete list of foods and beverages you should avoid. Contact a dietitian for more information. Where to find more information National Heart, Lung, and Blood Institute: https://wilson-eaton.com/ American Heart Association: www.heart.org Academy of Nutrition and Dietetics: www.eatright.[place]: www.kidney.org Summary The DASH eating plan is a healthy eating plan that has been shown to reduce high blood pressure (hypertension). It may also reduce your risk for type 2 diabetes, heart disease, and stroke. When on the DASH  eating plan, aim to eat more fresh fruits and vegetables, whole grains, lean proteins, low-fat dairy, and heart-healthy fats. With the DASH eating plan, you should limit salt (sodium) intake to 2,300 mg a day. If you have hypertension, you may need to reduce your sodium intake to 1,500 mg a day. Work with your health care provider or dietitian to adjust your eating plan to your individual calorie needs. This information is not intended to replace advice given to you by your health care provider. Make sure you discuss any questions you have with your health care provider. Document Revised: 02/08/2019 Document Reviewed: 02/08/2019 Elsevier Patient Education  2022 [employer].

## 2020-12-10 NOTE — Progress Notes (Signed)
Virtual Visit via Telephone Note  I connected with Cory Frazier on 12/10/20 at 10:30 AM EDT by telephone and verified that I am speaking with the correct person using two identifiers.  Location: Patient: home Provider: office   I discussed the limitations, risks, security and privacy concerns of performing an evaluation and management service by telephone and the availability of in person appointments. I also discussed with the patient that there may be a patient responsible charge related to this service. The patient expressed understanding and agreed to proceed.   History of Present Illness:  Patient presents today for a telephone visit for follow-up on hypertension and anxiety.  Patient was last seen here by Dr. Redmond Pulling on 11/30/2020.  He was prescribed metoprolol 25 mg twice daily.  And he was advised to start Zoloft 25 mg milligrams daily for anxiety.  Patient did not start Zoloft due to concerns for side effects.  He has been taking blood pressure medicine as prescribed and states that his blood pressure continues to remain elevated to remain elevated on average running over 150/90.  Patient admits that he does not eat a healthy diet.  He does eat mainly fast food that is high in sodium.  He has been trying to cut out salty snacks.  Patient states that at times he does have intermittent chest pain and palpitations.  We discussed that this could be due to anxiety but we will place a referral to cardiology for further evaluation.  Patient does have a strong family history of heart disease. Denies f/c/s, n/v/d, hemoptysis, PND, chest pain or edema.      Observations/Objective:  Vitals with BMI 11/30/2020 06/29/2020 06/15/2020  Height 5\' 6"  5' 6.969" 5' 6.969"  Weight 230 lbs 3 oz 228 lbs 232 lbs 3 oz  BMI 37.17 28.31 51.7  Systolic 616 - 073  Diastolic 710 - 96  Pulse 626 97 79      Assessment and Plan:  Anxiety and depression Has not started zoloft   2. Essential  hypertension Elevated reading - increase Metoprolol to 50 mg BID - will monitor   3. Gastroesophageal reflux disease without esophagitis Discussed dietary and activity options  Follow up:  Follow up as scheduled  Patient Instructions  Anxiety and depression Has not started zoloft   2. Essential hypertension Elevated reading - increase Metoprolol to 50 mg BID - will monitor   3. Gastroesophageal reflux disease without esophagitis Discussed dietary and activity options  Follow up:  Follow up as scheduled  Managing Your Hypertension Hypertension, also called high blood pressure, is when the force of the blood pressing against the walls of the arteries is too strong. Arteries are blood vessels that carry blood from your heart throughout your body. Hypertension forces the heart to work harder to pump blood and may cause the arteries to become narrow or stiff. Understanding blood pressure readings Your personal target blood pressure may vary depending on your medical conditions, your age, and other factors. A blood pressure reading includes a higher number over a lower number. Ideally, your blood pressure should be below 120/80. You should know that: The first, or top, number is called the systolic pressure. It is a measure of the pressure in your arteries as your heart beats. The second, or bottom number, is called the diastolic pressure. It is a measure of the pressure in your arteries as the heart relaxes. Blood pressure is classified into four stages. Based on your blood pressure reading, your health care provider  may use the following stages to determine what type of treatment you need, if any. Systolic pressure and diastolic pressure are measured in a unit called mmHg. Normal Systolic pressure: below 474. Diastolic pressure: below 80. Elevated Systolic pressure: 259-563. Diastolic pressure: below 80. Hypertension stage 1 Systolic pressure: 875-643. Diastolic pressure:  32-95. Hypertension stage 2 Systolic pressure: 188 or above. Diastolic pressure: 90 or above. How can this condition affect me? Managing your hypertension is an important responsibility. Over time, hypertension can damage the arteries and decrease blood flow to important parts of the body, including the brain, heart, and kidneys. Having untreated or uncontrolled hypertension can lead to: A heart attack. A stroke. A weakened blood vessel (aneurysm). Heart failure. Kidney damage. Eye damage. Metabolic syndrome. Memory and concentration problems. Vascular dementia. What actions can I take to manage this condition? Hypertension can be managed by making lifestyle changes and possibly by taking medicines. Your health care provider will help you make a plan to bring your blood pressure within a normal range. Nutrition  Eat a diet that is high in fiber and potassium, and low in salt (sodium), added sugar, and fat. An example eating plan is called the Dietary Approaches to Stop Hypertension (DASH) diet. To eat this way: Eat plenty of fresh fruits and vegetables. Try to fill one-half of your plate at each meal with fruits and vegetables. Eat whole grains, such as whole-wheat pasta, brown rice, or whole-grain bread. Fill about one-fourth of your plate with whole grains. Eat low-fat dairy products. Avoid fatty cuts of meat, processed or cured meats, and poultry with skin. Fill about one-fourth of your plate with lean proteins such as fish, chicken without skin, beans, eggs, and tofu. Avoid pre-made and processed foods. These tend to be higher in sodium, added sugar, and fat. Reduce your daily sodium intake. Most people with hypertension should eat less than 1,500 mg of sodium a day. Lifestyle  Work with your health care provider to maintain a healthy body weight or to lose weight. Ask what an ideal weight is for you. Get at least 30 minutes of exercise that causes your heart to beat faster (aerobic  exercise) most days of the week. Activities may include walking, swimming, or biking. Include exercise to strengthen your muscles (resistance exercise), such as weight lifting, as part of your weekly exercise routine. Try to do these types of exercises for 30 minutes at least 3 days a week. Do not use any products that contain nicotine or tobacco, such as cigarettes, e-cigarettes, and chewing tobacco. If you need help quitting, ask your health care provider. Control any long-term (chronic) conditions you have, such as high cholesterol or diabetes. Identify your sources of stress and find ways to manage stress. This may include meditation, deep breathing, or making time for fun activities. Alcohol use Do not drink alcohol if: Your health care provider tells you not to drink. You are pregnant, may be pregnant, or are planning to become pregnant. If you drink alcohol: Limit how much you use to: 0-1 drink a day for women. 0-2 drinks a day for men. Be aware of how much alcohol is in your drink. In the [place] one drink equals one 12 oz bottle of beer (355 mL), one 5 oz glass of wine (148 mL), or one 1 oz glass of hard liquor (44 mL). Medicines Your health care provider may prescribe medicine if lifestyle changes are not enough to get your blood pressure under control and if: Your systolic blood pressure  is 130 or higher. Your diastolic blood pressure is 80 or higher. Take medicines only as told by your health care provider. Follow the directions carefully. Blood pressure medicines must be taken as told by your health care provider. The medicine does not work as well when you skip doses. Skipping doses also puts you at risk for problems. Monitoring Before you monitor your blood pressure: Do not smoke, drink caffeinated beverages, or exercise within 30 minutes before taking a measurement. Use the bathroom and empty your bladder (urinate). Sit quietly for at least 5 minutes before taking  measurements. Monitor your blood pressure at home as told by your health care provider. To do this: Sit with your back straight and supported. Place your feet flat on the floor. Do not cross your legs. Support your arm on a flat surface, such as a table. Make sure your upper arm is at heart level. Each time you measure, take two or three readings one minute apart and record the results. You may also need to have your blood pressure checked regularly by your health care provider. General information Talk with your health care provider about your diet, exercise habits, and other lifestyle factors that may be contributing to hypertension. Review all the medicines you take with your health care provider because there may be side effects or interactions. Keep all visits as told by your health care provider. Your health care provider can help you create and adjust your plan for managing your high blood pressure. Where to find more information National Heart, Lung, and Blood Institute: https://wilson-eaton.com/ American Heart Association: www.heart.org Contact a health care provider if: You think you are having a reaction to medicines you have taken. You have repeated (recurrent) headaches. You feel dizzy. You have swelling in your ankles. You have trouble with your vision. Get help right away if: You develop a severe headache or confusion. You have unusual weakness or numbness, or you feel faint. You have severe pain in your chest or abdomen. You vomit repeatedly. You have trouble breathing. These symptoms may represent a serious problem that is an emergency. Do not wait to see if the symptoms will go away. Get medical help right away. Call your local emergency services (911 in the [place] Do not drive yourself to the hospital. Summary Hypertension is when the force of blood pumping through your arteries is too strong. If this condition is not controlled, it may put you at risk for serious  complications. Your personal target blood pressure may vary depending on your medical conditions, your age, and other factors. For most people, a normal blood pressure is less than 120/80. Hypertension is managed by lifestyle changes, medicines, or both. Lifestyle changes to help manage hypertension include losing weight, eating a healthy, low-sodium diet, exercising more, stopping smoking, and limiting alcohol. This information is not intended to replace advice given to you by your health care provider. Make sure you discuss any questions you have with your health care provider. Document Revised: 04/12/2019 Document Reviewed: 02/05/2019 Elsevier Patient Education  2022 [place] Eating Plan DASH stands for Dietary Approaches to Stop Hypertension. The DASH eating plan is a healthy eating plan that has been shown to: Reduce high blood pressure (hypertension). Reduce your risk for type 2 diabetes, heart disease, and stroke. Help with weight loss. What are tips for following this plan? Reading food labels Check food labels for the amount of salt (sodium) per serving. Choose foods with less than 5 percent of the Daily  Value of sodium. Generally, foods with less than 300 milligrams (mg) of sodium per serving fit into this eating plan. To find whole grains, look for the word "whole" as the first word in the ingredient list. Shopping Buy products labeled as "low-sodium" or "no salt added." Buy fresh foods. Avoid canned foods and pre-made or frozen meals. Cooking Avoid adding salt when cooking. Use salt-free seasonings or herbs instead of table salt or sea salt. Check with your health care provider or pharmacist before using salt substitutes. Do not fry foods. Cook foods using healthy methods such as baking, boiling, grilling, roasting, and broiling instead. Cook with heart-healthy oils, such as olive, canola, avocado, soybean, or sunflower oil. Meal planning  Eat a balanced diet that  includes: 4 or more servings of fruits and 4 or more servings of vegetables each day. Try to fill one-half of your plate with fruits and vegetables. 6-8 servings of whole grains each day. Less than 6 oz (170 g) of lean meat, poultry, or fish each day. A 3-oz (85-g) serving of meat is about the same size as a deck of cards. One egg equals 1 oz (28 g). 2-3 servings of low-fat dairy each day. One serving is 1 cup (237 mL). 1 serving of nuts, seeds, or beans 5 times each week. 2-3 servings of heart-healthy fats. Healthy fats called omega-3 fatty acids are found in foods such as walnuts, flaxseeds, fortified milks, and eggs. These fats are also found in cold-water fish, such as sardines, salmon, and mackerel. Limit how much you eat of: Canned or prepackaged foods. Food that is high in trans fat, such as some fried foods. Food that is high in saturated fat, such as fatty meat. Desserts and other sweets, sugary drinks, and other foods with added sugar. Full-fat dairy products. Do not salt foods before eating. Do not eat more than 4 egg yolks a week. Try to eat at least 2 vegetarian meals a week. Eat more home-cooked food and less restaurant, buffet, and fast food. Lifestyle When eating at a restaurant, ask that your food be prepared with less salt or no salt, if possible. If you drink alcohol: Limit how much you use to: 0-1 drink a day for women who are not pregnant. 0-2 drinks a day for men. Be aware of how much alcohol is in your drink. In the [place] one drink equals one 12 oz bottle of beer (355 mL), one 5 oz glass of wine (148 mL), or one 1 oz glass of hard liquor (44 mL). General information Avoid eating more than 2,300 mg of salt a day. If you have hypertension, you may need to reduce your sodium intake to 1,500 mg a day. Work with your health care provider to maintain a healthy body weight or to lose weight. Ask what an ideal weight is for you. Get at least 30 minutes of exercise that  causes your heart to beat faster (aerobic exercise) most days of the week. Activities may include walking, swimming, or biking. Work with your health care provider or dietitian to adjust your eating plan to your individual calorie needs. What foods should I eat? Fruits All fresh, dried, or frozen fruit. Canned fruit in natural juice (without added sugar). Vegetables Fresh or frozen vegetables (raw, steamed, roasted, or grilled). Low-sodium or reduced-sodium tomato and vegetable juice. Low-sodium or reduced-sodium tomato sauce and tomato paste. Low-sodium or reduced-sodium canned vegetables. Grains Whole-grain or whole-wheat bread. Whole-grain or whole-wheat pasta. Brown rice. [place]. Bulgur. Whole-grain and  low-sodium cereals. Pita bread. Low-fat, low-sodium crackers. Whole-wheat flour tortillas. Meats and other proteins Skinless chicken or [place]. Ground chicken or [place]. Pork with fat trimmed off. Fish and seafood. Egg whites. Dried beans, peas, or lentils. Unsalted nuts, nut butters, and seeds. Unsalted canned beans. Lean cuts of beef with fat trimmed off. Low-sodium, lean precooked or cured meat, such as sausages or meat loaves. Dairy Low-fat (1%) or fat-free (skim) milk. Reduced-fat, low-fat, or fat-free cheeses. Nonfat, low-sodium ricotta or cottage cheese. Low-fat or nonfat yogurt. Low-fat, low-sodium cheese. Fats and oils Soft margarine without trans fats. Vegetable oil. Reduced-fat, low-fat, or light mayonnaise and salad dressings (reduced-sodium). Canola, safflower, olive, avocado, soybean, and sunflower oils. Avocado. Seasonings and condiments Herbs. Spices. Seasoning mixes without salt. Other foods Unsalted popcorn and pretzels. Fat-free sweets. The items listed above may not be a complete list of foods and beverages you can eat. Contact a dietitian for more information. What foods should I avoid? Fruits Canned fruit in a light or heavy syrup. Fried fruit. Fruit in cream  or butter sauce. Vegetables Creamed or fried vegetables. Vegetables in a cheese sauce. Regular canned vegetables (not low-sodium or reduced-sodium). Regular canned tomato sauce and paste (not low-sodium or reduced-sodium). Regular tomato and vegetable juice (not low-sodium or reduced-sodium). Angie Fava. Olives. Grains Baked goods made with fat, such as croissants, muffins, or some breads. Dry pasta or rice meal packs. Meats and other proteins Fatty cuts of meat. Ribs. Fried meat. Berniece Salines. Bologna, salami, and other precooked or cured meats, such as sausages or meat loaves. Fat from the back of a pig (fatback). Bratwurst. Salted nuts and seeds. Canned beans with added salt. Canned or smoked fish. Whole eggs or egg yolks. Chicken or [place] with skin. Dairy Whole or 2% milk, cream, and half-and-half. Whole or full-fat cream cheese. Whole-fat or sweetened yogurt. Full-fat cheese. Nondairy creamers. Whipped toppings. Processed cheese and cheese spreads. Fats and oils Butter. Stick margarine. Lard. Shortening. Ghee. Bacon fat. Tropical oils, such as coconut, palm kernel, or palm oil. Seasonings and condiments Onion salt, garlic salt, seasoned salt, table salt, and sea salt. Worcestershire sauce. Tartar sauce. Barbecue sauce. Teriyaki sauce. Soy sauce, including reduced-sodium. Steak sauce. Canned and packaged gravies. Fish sauce. Oyster sauce. Cocktail sauce. Store-bought horseradish. Ketchup. Mustard. Meat flavorings and tenderizers. Bouillon cubes. Hot sauces. Pre-made or packaged marinades. Pre-made or packaged taco seasonings. Relishes. Regular salad dressings. Other foods Salted popcorn and pretzels. The items listed above may not be a complete list of foods and beverages you should avoid. Contact a dietitian for more information. Where to find more information National Heart, Lung, and Blood Institute: https://wilson-eaton.com/ American Heart Association: www.heart.org Academy of Nutrition and Dietetics:  www.eatright.[place]: www.kidney.org Summary The DASH eating plan is a healthy eating plan that has been shown to reduce high blood pressure (hypertension). It may also reduce your risk for type 2 diabetes, heart disease, and stroke. When on the DASH eating plan, aim to eat more fresh fruits and vegetables, whole grains, lean proteins, low-fat dairy, and heart-healthy fats. With the DASH eating plan, you should limit salt (sodium) intake to 2,300 mg a day. If you have hypertension, you may need to reduce your sodium intake to 1,500 mg a day. Work with your health care provider or dietitian to adjust your eating plan to your individual calorie needs. This information is not intended to replace advice given to you by your health care provider. Make sure you discuss any questions you have with your health  care provider. Document Revised: 02/08/2019 Document Reviewed: 02/08/2019 Elsevier Patient Education  2022 [employer].         I discussed the assessment and treatment plan with the patient. The patient was provided an opportunity to ask questions and all were answered. The patient agreed with the plan and demonstrated an understanding of the instructions.   The patient was advised to call back or seek an in-person evaluation if the symptoms worsen or if the condition fails to improve as anticipated.  I provided 23 minutes of non-face-to-face time during this encounter.   Fenton Foy, NP

## 2020-12-15 ENCOUNTER — Telehealth: Payer: Self-pay | Admitting: Family

## 2020-12-15 NOTE — Telephone Encounter (Signed)
Patient called in stating the medication is not working. He stated he has doubled the am dose. Patient is asking for a call back to see what the next step will be.

## 2020-12-16 NOTE — Telephone Encounter (Signed)
Patient will need an in office visit to address blood pressure. Thanks.

## 2020-12-17 NOTE — Progress Notes (Signed)
Patient ID: Cory Frazier, male    DOB: Aug 09, 1975  MRN: [id number]  CC: Hypertension Follow-Up  Subjective: Cory Frazier is a 45 y.o. male who presents for hypertension follow-up. He is accompanied by his friend.  His concerns today include:   HYPERTENSION FOLLOW-UP: 12/10/2020 with Lazaro Arms, NP: Elevated reading - increase Metoprolol to 50 mg BID - will monitor  12/18/2020: Currently taking: see medication list Med Adherence: [x]  Yes    []  No Medication side effects: []  Yes    [x]  No Adherence with salt restriction (low-salt diet): []  Yes  [x]  No Exercise: Yes []  No [x]   but becoming active more recently Home Monitoring?: [x]  Yes    []  No Monitoring Frequency: [x]  Yes    []  No Home BP results range: [x]  Yes  150's/100's-110's Smoking []  Yes [x]  No SOB? []  Yes    [x]  No Chest Pain?: Sometimes when having anxiety. However, reports anxiety has improved since beginning Metoprolol. Reports he never took Sertraline and prefers to not take at the moment.  Comments: Requesting vitamin D levels be rechecked.    Patient Active Problem List   Diagnosis Date Noted   Vitamin D deficiency 06/30/2020     Current Outpatient Medications on File Prior to Visit  Medication Sig Dispense Refill   aspirin 81 MG chewable tablet Chew 81 mg by mouth daily.     Cholecalciferol 1.25 MG (50000 UT) TABS Take 1 tablet by mouth once a week. 12 tablet 0   metoprolol tartrate (LOPRESSOR) 25 MG tablet Take 1 tablet (25 mg total) by mouth 2 (two) times daily. 60 tablet 0   sertraline (ZOLOFT) 25 MG tablet Take 1 tablet (25 mg total) by mouth daily. 30 tablet 0   No current facility-administered medications on file prior to visit.    Allergies  Allergen Reactions   Codeine Other (See Comments)    unknown unknown   Claritin [Loratadine] Anxiety    Social History   Socioeconomic History   Marital status: Single    Spouse name: Not on file   Number of children: Not on file   Years of  education: Not on file   Highest education level: Not on file  Occupational History   Not on file  Tobacco Use   Smoking status: Never   Smokeless tobacco: Never  Vaping Use   Vaping Use: Never used  Substance and Sexual Activity   Alcohol use: Yes    Comment: Occasional    Drug use: No   Sexual activity: Not Currently  Other Topics Concern   Not on file  Social History Narrative   Not on file   Social Determinants of Health   Financial Resource Strain: Not on file  Food Insecurity: Not on file  Transportation Needs: Not on file  Physical Activity: Not on file  Stress: Not on file  Social Connections: Not on file  Intimate Partner Violence: Not on file    Family History  Family history unknown: Yes    No past surgical history on file.  ROS: Review of Systems Negative except as stated above  PHYSICAL EXAM: BP (!) 141/97   Physical Exam HENT:     Head: Normocephalic and atraumatic.  Eyes:     Extraocular Movements: Extraocular movements intact.     Conjunctiva/sclera: Conjunctivae normal.     Pupils: Pupils are equal, round, and reactive to light.  Cardiovascular:     Rate and Rhythm: Normal rate and regular rhythm.  Pulses: Normal pulses.     Heart sounds: Normal heart sounds.  Pulmonary:     Effort: Pulmonary effort is normal.     Breath sounds: Normal breath sounds.  Musculoskeletal:     Cervical back: Normal range of motion and neck supple.  Neurological:     General: No focal deficit present.     Mental Status: He is alert and oriented to person, place, and time.  Psychiatric:        Mood and Affect: Mood normal.        Behavior: Behavior normal.    ASSESSMENT AND PLAN: 1. Essential hypertension: - Blood pressure not at goal during today's visit. Patient asymptomatic without chest pressure, chest pain, palpitations, shortness of breath, worst headache of life, and any additional red flag symptoms. - Continue Metoprolol Tartrate as  prescribed, 25 mg by mouth twice daily. No refills needed. - Begin Losartan as prescribed.  - Begin Hydrochlorothiazide as prescribed.  - Counseled on blood pressure goal of less than 130/80, low-sodium, DASH diet, medication compliance, 150 minutes of moderate intensity exercise per week as tolerated. Discussed medication compliance, adverse effects. - BMP to evaluate kidney function and electrolyte balance. - Follow-up with primary provider in 2 weeks or sooner if needed.  - Basic Metabolic Panel - losartan (COZAAR) 25 MG tablet; Take 1 tablet (25 mg total) by mouth daily.  Dispense: 30 tablet; Refill: 0 - hydrochlorothiazide (HYDRODIURIL) 12.5 MG tablet; Take 1 tablet (12.5 mg total) by mouth daily.  Dispense: 30 tablet; Refill: 0  2. Encounter for vitamin deficiency screening: - Screening for deficiency.  - Vitamin D, 25-hydroxy   Patient was given the opportunity to ask questions.  Patient verbalized understanding of the plan and was able to repeat key elements of the plan. Patient was given clear instructions to go to Emergency Department or return to medical center if symptoms don't improve, worsen, or new problems develop.The patient verbalized understanding.   Orders Placed This Encounter  Procedures   Basic Metabolic Panel   Vitamin D, 25-hydroxy     Requested Prescriptions   Signed Prescriptions Disp Refills   losartan (COZAAR) 25 MG tablet 30 tablet 0    Sig: Take 1 tablet (25 mg total) by mouth daily.   hydrochlorothiazide (HYDRODIURIL) 12.5 MG tablet 30 tablet 0    Sig: Take 1 tablet (12.5 mg total) by mouth daily.    Return in about 2 weeks (around 01/01/2021) for Follow-Up or next available hypertension.  Camillia Herter, NP

## 2020-12-18 ENCOUNTER — Other Ambulatory Visit: Payer: Self-pay

## 2020-12-18 ENCOUNTER — Ambulatory Visit (INDEPENDENT_AMBULATORY_CARE_PROVIDER_SITE_OTHER): Payer: Self-pay | Admitting: Family

## 2020-12-18 VITALS — BP 141/97

## 2020-12-18 DIAGNOSIS — I10 Essential (primary) hypertension: Secondary | ICD-10-CM

## 2020-12-18 DIAGNOSIS — Z1321 Encounter for screening for nutritional disorder: Secondary | ICD-10-CM

## 2020-12-18 MED ORDER — HYDROCHLOROTHIAZIDE 12.5 MG PO TABS
12.5000 mg | ORAL_TABLET | Freq: Every day | ORAL | 0 refills | Status: DC
Start: 2020-12-18 — End: 2021-01-05

## 2020-12-18 MED ORDER — LOSARTAN POTASSIUM 25 MG PO TABS: 25.0000 mg | ORAL_TABLET | Freq: Every day | ORAL | 0 refills | Status: DC

## 2020-12-18 NOTE — Progress Notes (Signed)
Pt presents for elevated bp

## 2020-12-21 ENCOUNTER — Telehealth: Payer: Self-pay | Admitting: Family

## 2020-12-21 ENCOUNTER — Other Ambulatory Visit: Payer: Self-pay

## 2020-12-21 DIAGNOSIS — I10 Essential (primary) hypertension: Secondary | ICD-10-CM

## 2020-12-21 MED ORDER — LOSARTAN POTASSIUM 25 MG PO TABS: 25.0000 mg | ORAL_TABLET | Freq: Every day | ORAL | 0 refills | Status: DC

## 2020-12-21 NOTE — Telephone Encounter (Signed)
metoprolol tartrate (LOPRESSOR) 25 MG tablet [[id number]]    Pharmacy  [facility] [id number], [place] [address]  Phone:  [phone]  Fax:  [phone]

## 2020-12-22 ENCOUNTER — Other Ambulatory Visit: Payer: Self-pay

## 2020-12-22 ENCOUNTER — Other Ambulatory Visit: Payer: Self-pay | Admitting: Family Medicine

## 2020-12-22 DIAGNOSIS — I10 Essential (primary) hypertension: Secondary | ICD-10-CM

## 2020-12-22 LAB — BASIC METABOLIC PANEL
BUN/Creatinine Ratio: 11 (ref 9–20)
BUN: 11 mg/dL (ref 6–24)
CO2: 22 mmol/L (ref 20–29)
Calcium: 9.5 mg/dL (ref 8.7–10.2)
Chloride: 106 mmol/L (ref 96–106)
Creatinine, Ser: 1.01 mg/dL (ref 0.76–1.27)
Glucose: 83 mg/dL (ref 70–99)
Potassium: 4.5 mmol/L (ref 3.5–5.2)
Sodium: 141 mmol/L (ref 134–144)
eGFR: 93 mL/min/{1.73_m2} (ref >59)

## 2020-12-22 LAB — VITAMIN D 25 HYDROXY (VIT D DEFICIENCY, FRACTURES): Vit D, 25-Hydroxy: 31.5 ng/mL (ref 30.0–100.0)

## 2020-12-22 LAB — SPECIMEN STATUS REPORT

## 2020-12-22 MED ORDER — METOPROLOL TARTRATE 25 MG PO TABS: 25.0000 mg | ORAL_TABLET | Freq: Two times a day (BID) | ORAL | 0 refills | Status: DC

## 2020-12-22 NOTE — Progress Notes (Signed)
Kidney function normal.   Vitamin D normal.

## 2020-12-29 ENCOUNTER — Ambulatory Visit: Payer: Self-pay | Admitting: Family

## 2020-12-30 NOTE — Progress Notes (Signed)
Patient ID: Cory Frazier, male    DOB: [birth year]  MRN: [id number]  CC: Hypertension Follow-Up   Subjective: Cory Frazier is a 45 y.o. male who presents for hypertension follow-up.   His concerns today include:   HYPERTENSION FOLLOW-UP: 12/18/2020: - Continue Metoprolol Tartrate as prescribed, 25 mg by mouth twice daily. No refills needed. - Begin Losartan as prescribed.  - Begin Hydrochlorothiazide as prescribed.   01/05/2021: Doing well on current regimen. No side effects. No issues/concerns. Denies chest pain and shortness of breath. Checking blood pressures at home. Reports overall feeling well. Plans to schedule eye exam soon as thinks infrequent dizziness may be related to the need for eye exam. Reports last eye exam was when he was 45 years old. Reports after eye exam will follow-up with primary provider if would like referral back to ENT.   Patient Active Problem List   Diagnosis Date Noted   Vitamin D deficiency 06/30/2020     Current Outpatient Medications on File Prior to Visit  Medication Sig Dispense Refill   aspirin 81 MG chewable tablet Chew 81 mg by mouth daily.     Cholecalciferol 1.25 MG (50000 UT) TABS Take 1 tablet by mouth once a week. (Patient not taking: Reported on 01/05/2021) 12 tablet 0   sertraline (ZOLOFT) 25 MG tablet Take 1 tablet (25 mg total) by mouth daily. (Patient not taking: Reported on 01/05/2021) 30 tablet 0   No current facility-administered medications on file prior to visit.    Allergies  Allergen Reactions   Codeine Other (See Comments)    unknown unknown   Claritin [Loratadine] Anxiety    Social History   Socioeconomic History   Marital status: Single    Spouse name: Not on file   Number of children: Not on file   Years of education: Not on file   Highest education level: Not on file  Occupational History   Not on file  Tobacco Use   Smoking status: Never   Smokeless tobacco: Never  Vaping Use   Vaping Use: Never  used  Substance and Sexual Activity   Alcohol use: Yes    Comment: Occasional    Drug use: No   Sexual activity: Not Currently  Other Topics Concern   Not on file  Social History Narrative   Not on file   Social Determinants of Health   Financial Resource Strain: Not on file  Food Insecurity: Not on file  Transportation Needs: Not on file  Physical Activity: Not on file  Stress: Not on file  Social Connections: Not on file  Intimate Partner Violence: Not on file    Family History  Family history unknown: Yes    No past surgical history on file.  ROS: Review of Systems Negative except as stated above  PHYSICAL EXAM: BP 135/90   Pulse 83   Temp 98.4 F (36.9 C) (Oral)   Resp 20   Wt 226 lb (102.5 kg)   SpO2 97%   BMI 36.48 kg/m   Physical Exam HENT:     Head: Normocephalic and atraumatic.  Eyes:     Extraocular Movements: Extraocular movements intact.     Conjunctiva/sclera: Conjunctivae normal.     Pupils: Pupils are equal, round, and reactive to light.  Cardiovascular:     Rate and Rhythm: Normal rate and regular rhythm.     Pulses: Normal pulses.     Heart sounds: Normal heart sounds.  Pulmonary:     Effort: Pulmonary effort  is normal.     Breath sounds: Normal breath sounds.  Musculoskeletal:     Cervical back: Normal range of motion and neck supple.  Neurological:     General: No focal deficit present.     Mental Status: He is alert and oriented to person, place, and time.  Psychiatric:        Mood and Affect: Mood normal.        Behavior: Behavior normal.   ASSESSMENT AND PLAN: 1. Essential hypertension: - Continue Metoprolol Tartrate, Hydrochlorothiazide, and Losartan as prescribed.  - Counseled on blood pressure goal of less than 130/80, low-sodium, DASH diet, medication compliance, 150 minutes of moderate intensity exercise per week as tolerated. Discussed medication compliance, adverse effects. - BMP to evaluate kidney function and  electrolyte balance. - Follow-up with primary provider in 3 months or sooner if needed.  - Basic Metabolic Panel - metoprolol tartrate (LOPRESSOR) 25 MG tablet; Take 1 tablet (25 mg total) by mouth 2 (two) times daily.  Dispense: 180 tablet; Refill: 0 - hydrochlorothiazide (HYDRODIURIL) 12.5 MG tablet; Take 1 tablet (12.5 mg total) by mouth daily.  Dispense: 90 tablet; Refill: 0 - losartan (COZAAR) 25 MG tablet; Take 1 tablet (25 mg total) by mouth daily.  Dispense: 90 tablet; Refill: 0   Patient was given the opportunity to ask questions.  Patient verbalized understanding of the plan and was able to repeat key elements of the plan. Patient was given clear instructions to go to Emergency Department or return to medical center if symptoms don't improve, worsen, or new problems develop.The patient verbalized understanding.   Orders Placed This Encounter  Procedures   Basic Metabolic Panel     Requested Prescriptions   Signed Prescriptions Disp Refills   metoprolol tartrate (LOPRESSOR) 25 MG tablet 180 tablet 0    Sig: Take 1 tablet (25 mg total) by mouth 2 (two) times daily.   hydrochlorothiazide (HYDRODIURIL) 12.5 MG tablet 90 tablet 0    Sig: Take 1 tablet (12.5 mg total) by mouth daily.   losartan (COZAAR) 25 MG tablet 90 tablet 0    Sig: Take 1 tablet (25 mg total) by mouth daily.    Return in about 3 months (around 04/07/2021) for Follow-Up or next available hypertension .  Camillia Herter, NP

## 2021-01-05 ENCOUNTER — Ambulatory Visit (INDEPENDENT_AMBULATORY_CARE_PROVIDER_SITE_OTHER): Payer: Self-pay | Admitting: Family

## 2021-01-05 ENCOUNTER — Other Ambulatory Visit: Payer: Self-pay

## 2021-01-05 ENCOUNTER — Encounter: Payer: Self-pay | Admitting: Family

## 2021-01-05 VITALS — BP 135/90 | HR 83 | Temp 98.4°F | Resp 20 | Wt 226.0 lb

## 2021-01-05 DIAGNOSIS — I10 Essential (primary) hypertension: Secondary | ICD-10-CM

## 2021-01-05 MED ORDER — HYDROCHLOROTHIAZIDE 12.5 MG PO TABS: 12.5000 mg | ORAL_TABLET | Freq: Every day | ORAL | 0 refills | Status: DC

## 2021-01-05 MED ORDER — METOPROLOL TARTRATE 25 MG PO TABS: 25.0000 mg | ORAL_TABLET | Freq: Two times a day (BID) | ORAL | 0 refills | Status: DC

## 2021-01-05 MED ORDER — LOSARTAN POTASSIUM 25 MG PO TABS: 25.0000 mg | ORAL_TABLET | Freq: Every day | ORAL | 0 refills | Status: DC

## 2021-01-05 NOTE — Patient Instructions (Signed)

## 2021-01-05 NOTE — Progress Notes (Signed)
F/u HTN 

## 2021-01-06 LAB — BASIC METABOLIC PANEL
BUN/Creatinine Ratio: 17 (ref 9–20)
BUN: 19 mg/dL (ref 6–24)
CO2: 21 mmol/L (ref 20–29)
Calcium: 9.8 mg/dL (ref 8.7–10.2)
Chloride: 98 mmol/L (ref 96–106)
Creatinine, Ser: 1.1 mg/dL (ref 0.76–1.27)
Glucose: 82 mg/dL (ref 70–99)
Potassium: 4.5 mmol/L (ref 3.5–5.2)
Sodium: 140 mmol/L (ref 134–144)
eGFR: 84 mL/min/{1.73_m2} (ref >59)

## 2021-01-06 NOTE — Progress Notes (Signed)
Kidney function normal.   Sodium, potassium, calcium normal.

## 2021-04-04 NOTE — Progress Notes (Signed)
Patient ID: Cory Frazier, male    DOB: 12-May-1975  MRN: [id number]  CC: Hypertension Follow-Up   Subjective: Cory Frazier is a 46 y.o. male who presents for hypertension follow-up.   His concerns today include:  HYPERTENSION FOLLOW-UP: 01/05/2021: - Continue Metoprolol Tartrate, Hydrochlorothiazide, and Losartan as prescribed.   04/07/2021: Doing well on current regimen. No side effects. No issues/concerns. Denies chest pain and shortness of breath.   2. ACID REFLUX: Reports taking Omeprazole and Pepcid in the past and caused dizziness. Currently taking over-the-counter Mylanta and Tums. Reports could use improvement of diet. Endorses chest discomfort sometimes but not chest pain.   3. ANXIETY DEPRESSION FOLLOW-UP: 11/30/2020 per MD note: Discussed compliance - patient to start zoloft 25 mg  04/07/2021: Reports never took Zoloft, still has medication at home. Reports has jitters and feeling weird. Concern for taking medications because may cause side effect.   4. ERECTILE CONCERNS: Reports can get an erection but sometimes difficult to keep. Says things aren't the same. Not ready to begin medication. Not ready for referral to Urology.   5. STOMACH CONCERNS: Denies abdominal pain, nausea, vomiting, blood in stool, and additional red flag symptoms. Reports stomach is always tight. Bowel movements normal.   6. VITAMIN D SCREENING: Wants updated screening. Taking over-the-counter supplement.   Depression screen [facility] 2/9 04/07/2021 01/05/2021 12/18/2020 11/30/2020 07/27/2020  Decreased Interest 0 2 0 1 2  Down, Depressed, Hopeless 0 2 0 3 2  PHQ - 2 Score 0 4 0 4 4  Altered sleeping - 2 0 2 1  Tired, decreased energy - 1 0 2 1  Change in appetite - 0 0 1 1  Feeling bad or failure about yourself  - 3 0 3 1  Trouble concentrating - 0 0 1 0  Moving slowly or fidgety/restless - 0 0 0 0  Suicidal thoughts - 0 0 0 0  PHQ-9 Score - 10 0 13 8  Difficult doing work/chores - - Not  difficult at all Very difficult Not difficult at all     Patient Active Problem List   Diagnosis Date Noted   Vitamin D deficiency 06/30/2020     Current Outpatient Medications on File Prior to Visit  Medication Sig Dispense Refill   aspirin 81 MG chewable tablet Chew 81 mg by mouth daily.     Cholecalciferol 1.25 MG (50000 UT) TABS Take 1 tablet by mouth once a week. (Patient not taking: Reported on 01/05/2021) 12 tablet 0   sertraline (ZOLOFT) 25 MG tablet Take 1 tablet (25 mg total) by mouth daily. (Patient not taking: Reported on 01/05/2021) 30 tablet 0   No current facility-administered medications on file prior to visit.    Allergies  Allergen Reactions   Codeine Other (See Comments)    unknown unknown   Claritin [Loratadine] Anxiety    Social History   Socioeconomic History   Marital status: Single    Spouse name: Not on file   Number of children: Not on file   Years of education: Not on file   Highest education level: Not on file  Occupational History   Not on file  Tobacco Use   Smoking status: Never   Smokeless tobacco: Never  Vaping Use   Vaping Use: Never used  Substance and Sexual Activity   Alcohol use: Yes    Comment: Occasional    Drug use: No   Sexual activity: Not Currently  Other Topics Concern   Not on file  Social  History Narrative   Not on file   Social Determinants of Health   Financial Resource Strain: Not on file  Food Insecurity: Not on file  Transportation Needs: Not on file  Physical Activity: Not on file  Stress: Not on file  Social Connections: Not on file  Intimate Partner Violence: Not on file    Family History  Family history unknown: Yes    No past surgical history on file.  ROS: Review of Systems Negative except as stated above  PHYSICAL EXAM: BP 127/87 (BP Location: Left Arm, Patient Position: Sitting, Cuff Size: Large)    Pulse 71    Temp 98.3 F (36.8 C)    Resp 18    Ht 5' 5.98" (1.676 m)    Wt 236 lb  (107 kg)    SpO2 96%    BMI 38.11 kg/m   Physical Exam HENT:     Head: Normocephalic and atraumatic.  Eyes:     Extraocular Movements: Extraocular movements intact.     Conjunctiva/sclera: Conjunctivae normal.     Pupils: Pupils are equal, round, and reactive to light.  Cardiovascular:     Rate and Rhythm: Normal rate and regular rhythm.     Pulses: Normal pulses.     Heart sounds: Normal heart sounds.  Pulmonary:     Effort: Pulmonary effort is normal.     Breath sounds: Normal breath sounds.  Abdominal:     General: Bowel sounds are normal.     Palpations: Abdomen is soft.  Musculoskeletal:     Cervical back: Normal range of motion and neck supple.  Neurological:     General: No focal deficit present.     Mental Status: He is alert and oriented to person, place, and time.  Psychiatric:        Mood and Affect: Mood normal.        Behavior: Behavior normal.   ASSESSMENT AND PLAN: 1. Essential hypertension: - Continue Metoprolol Tartrate, Hydrochlorothiazide, and Losartan as prescribed.  - Counseled on blood pressure goal of less than 130/80, low-sodium, DASH diet, medication compliance, 150 minutes of moderate intensity exercise per week as tolerated. Discussed medication compliance, adverse effects. - Update BMP. - Follow-up with primary provider in 3 months or sooner if needed.  - metoprolol tartrate (LOPRESSOR) 25 MG tablet; Take 1 tablet (25 mg total) by mouth 2 (two) times daily.  Dispense: 180 tablet; Refill: 0 - hydrochlorothiazide (HYDRODIURIL) 12.5 MG tablet; Take 1 tablet (12.5 mg total) by mouth daily.  Dispense: 90 tablet; Refill: 0 - losartan (COZAAR) 25 MG tablet; Take 1 tablet (25 mg total) by mouth daily.  Dispense: 90 tablet; Refill: 0 - Basic Metabolic Panel  2. Gastroesophageal reflux disease, unspecified whether esophagitis present: - Patient intolerant to proton pump inhibitors and histamine-2 blockers.  - Referral to Gastroenterology for further  evaluation and management. - Ambulatory referral to Gastroenterology  3. Anxiety and depression: - Patient denies thoughts of self-harm, suicidal ideations, homicidal ideations. - Patient reports not ready to begin previously prescribed Sertraline. - Follow-up with primary provider as scheduled.   4. Erectile dysfunction, unspecified erectile dysfunction type: - Patient declined pharmacological therapy.  - Patient declined referral to Urology.  - Follow-up with primary provider as scheduled.  5. Colon cancer screening declined: - Patient declined screening.   6. Vitamin D deficiency: - Update screening today. - Vitamin D, 25-hydroxy  Patient was given the opportunity to ask questions.  Patient verbalized understanding of the plan and was able  to repeat key elements of the plan. Patient was given clear instructions to go to Emergency Department or return to medical center if symptoms don't improve, worsen, or new problems develop.The patient verbalized understanding.   Orders Placed This Encounter  Procedures   Vitamin D, [id number]   Basic Metabolic Panel    Requested Prescriptions   Signed Prescriptions Disp Refills   metoprolol tartrate (LOPRESSOR) 25 MG tablet 180 tablet 0    Sig: Take 1 tablet (25 mg total) by mouth 2 (two) times daily.   hydrochlorothiazide (HYDRODIURIL) 12.5 MG tablet 90 tablet 0    Sig: Take 1 tablet (12.5 mg total) by mouth daily.   losartan (COZAAR) 25 MG tablet 90 tablet 0    Sig: Take 1 tablet (25 mg total) by mouth daily.    Return in about 3 months (around 07/06/2021) for Follow-Up or next available hypertension .  Camillia Herter, NP

## 2021-04-07 ENCOUNTER — Encounter: Payer: Self-pay | Admitting: Family

## 2021-04-07 ENCOUNTER — Other Ambulatory Visit: Payer: Self-pay

## 2021-04-07 ENCOUNTER — Ambulatory Visit (INDEPENDENT_AMBULATORY_CARE_PROVIDER_SITE_OTHER): Payer: Self-pay | Admitting: Family

## 2021-04-07 VITALS — BP 127/87 | HR 71 | Temp 98.3°F | Resp 18 | Ht 65.98 in | Wt 236.0 lb

## 2021-04-07 DIAGNOSIS — F419 Anxiety disorder, unspecified: Secondary | ICD-10-CM

## 2021-04-07 DIAGNOSIS — Z5320 Procedure and treatment not carried out because of patient's decision for unspecified reasons: Secondary | ICD-10-CM

## 2021-04-07 DIAGNOSIS — I10 Essential (primary) hypertension: Secondary | ICD-10-CM

## 2021-04-07 DIAGNOSIS — K219 Gastro-esophageal reflux disease without esophagitis: Secondary | ICD-10-CM

## 2021-04-07 DIAGNOSIS — N529 Male erectile dysfunction, unspecified: Secondary | ICD-10-CM

## 2021-04-07 DIAGNOSIS — E559 Vitamin D deficiency, unspecified: Secondary | ICD-10-CM

## 2021-04-07 DIAGNOSIS — F32A Depression, unspecified: Secondary | ICD-10-CM

## 2021-04-07 MED ORDER — METOPROLOL TARTRATE 25 MG PO TABS: 25.0000 mg | ORAL_TABLET | Freq: Two times a day (BID) | ORAL | 0 refills | Status: DC

## 2021-04-07 MED ORDER — LOSARTAN POTASSIUM 25 MG PO TABS: 25.0000 mg | ORAL_TABLET | Freq: Every day | ORAL | 0 refills | Status: DC

## 2021-04-07 MED ORDER — HYDROCHLOROTHIAZIDE 12.5 MG PO TABS: 12.5000 mg | ORAL_TABLET | Freq: Every day | ORAL | 0 refills | Status: DC

## 2021-04-07 NOTE — Patient Instructions (Signed)
Metoprolol Tablets What is this medication? METOPROLOL (me TOE proe lole) treats high blood pressure. It also prevents chest pain (angina) or further damage after a heart attack. It works by lowering your blood pressure and heart rate, making it easier for your heart to pump blood to the rest of your body. It belongs to a group of medications called beta blockers. This medicine may be used for other purposes; ask your health care provider or pharmacist if you have questions. COMMON BRAND NAME(S): Lopressor What should I tell my care team before I take this medication? They need to know if you have any of these conditions: Diabetes Heart or vessel disease like slow heart rate, worsening heart failure, heart block, sick sinus syndrome, or Raynaud's disease Kidney disease Liver disease Lung or breathing disease, like asthma or emphysema Pheochromocytoma Thyroid disease An unusual or allergic reaction to metoprolol, other beta blockers, medications, foods, dyes, or preservatives Pregnant or trying to get pregnant Breast-feeding How should I use this medication? Take this medication by mouth with water. Take it as directed on the prescription label at the same time every day. You can take it with or without food. You should always take it the same way. Keep taking it unless your care team tells you to stop. Talk to your care team about the use of this medication in children. Special care may be needed. Overdosage: If you think you have taken too much of this medicine contact a poison control center or emergency room at once. NOTE: This medicine is only for you. Do not share this medicine with others. What if I miss a dose? If you miss a dose, take it as soon as you can. If it is almost time for your next dose, take only that dose. Do not take double or extra doses. What may interact with this medication? This medication may interact with the following: Certain medications for blood pressure,  heart disease, irregular heartbeat Certain medications for depression like monoamine oxidase (MAO) inhibitors, fluoxetine, or paroxetine Clonidine Dobutamine Epinephrine Isoproterenol Reserpine This list may not describe all possible interactions. Give your health care provider a list of all the medicines, herbs, non-prescription drugs, or dietary supplements you use. Also tell them if you smoke, drink alcohol, or use illegal drugs. Some items may interact with your medicine. What should I watch for while using this medication? Visit your care team for regular checks on your progress. Check your blood pressure as directed. Ask your care team what your blood pressure should be. Also, find out when you should contact them. Do not treat yourself for coughs, colds, or pain while you are using this medication without asking your care team for advice. Some medications may increase your blood pressure. You may get drowsy or dizzy. Do not drive, use machinery, or do anything that needs mental alertness until you know how this medication affects you. Do not stand up or sit up quickly, especially if you are an older patient. This reduces the risk of dizzy or fainting spells. Alcohol may interfere with the effect of this medication. Avoid alcoholic drinks. This medication may increase blood sugar. Ask your care team if changes in diet or medications are needed if you have diabetes. What side effects may I notice from receiving this medication? Side effects that you should report to your care team as soon as possible: Allergic reactions--skin rash, itching, hives, swelling of the face, lips, tongue, or throat Heart failure--shortness of breath, swelling of the ankles, feet,  or hands, sudden weight gain, unusual weakness or fatigue Low blood pressure--dizziness, feeling faint or lightheaded, blurry vision Raynaud's--cool, numb, or painful fingers or toes that may change color from pale, to blue, to red Slow  heartbeat--dizziness, feeling faint or lightheaded, confusion, trouble breathing, unusual weakness or fatigue Worsening mood, feelings of depression Side effects that usually do not require medical attention (report to your care team if they continue or are bothersome): Change in sex drive or performance Diarrhea Dizziness Fatigue Headache This list may not describe all possible side effects. Call your doctor for medical advice about side effects. You may report side effects to FDA at 1-800-FDA-1088. Where should I keep my medication? Keep out of the reach of children and pets. Store at room temperature between 15 and 30 degrees C (59 and 86 degrees F). Protect from moisture. Keep the container tightly closed. Throw away any unused medication after the expiration date. NOTE: This sheet is a summary. It may not cover all possible information. If you have questions about this medicine, talk to your doctor, pharmacist, or health care provider.  2022 [employer]vier/Gold Standard (2020-11-24 00:00:00)

## 2021-04-07 NOTE — Progress Notes (Signed)
Pt presents for hypertension follow-up  ?

## 2021-04-08 ENCOUNTER — Other Ambulatory Visit: Payer: Self-pay | Admitting: Family

## 2021-04-08 DIAGNOSIS — E559 Vitamin D deficiency, unspecified: Secondary | ICD-10-CM

## 2021-04-08 LAB — VITAMIN D 25 HYDROXY (VIT D DEFICIENCY, FRACTURES): Vit D, 25-Hydroxy: 26 ng/mL — ABNORMAL LOW (ref 30.0–100.0)

## 2021-04-08 LAB — BASIC METABOLIC PANEL
BUN/Creatinine Ratio: 15 (ref 9–20)
BUN: 18 mg/dL (ref 6–24)
CO2: 20 mmol/L (ref 20–29)
Calcium: 10.2 mg/dL (ref 8.7–10.2)
Chloride: 100 mmol/L (ref 96–106)
Creatinine, Ser: 1.2 mg/dL (ref 0.76–1.27)
Glucose: 96 mg/dL (ref 70–99)
Potassium: 4 mmol/L (ref 3.5–5.2)
Sodium: 145 mmol/L — ABNORMAL HIGH (ref 134–144)
eGFR: 76 mL/min/{1.73_m2} (ref >59)

## 2021-04-08 MED ORDER — CHOLECALCIFEROL 1.25 MG (50000 UT) PO TABS: 1.0000 | ORAL_TABLET | ORAL | 0 refills | Status: DC

## 2021-04-08 NOTE — Progress Notes (Signed)
Kidney function normal.   Vitamin D lower than normal. Continue prescribed supplement and recheck in vitamin D in 12 weeks.

## 2021-07-03 NOTE — Progress Notes (Deleted)
Patient ID: Cory Frazier, male    DOB: [birth year]  MRN: [id number]  CC: Hypertension Follow-Up  Subjective: Cory Frazier is a 46 y.o. male who presents for hypertension follow-up.   His concerns today include:  HYPERTENSION FOLLOW-UP: 04/07/2021: - Continue Metoprolol Tartrate, Hydrochlorothiazide, and Losartan as prescribed.   07/08/2021:  Patient Active Problem List   Diagnosis Date Noted   Vitamin D deficiency 06/30/2020     Current Outpatient Medications on File Prior to Visit  Medication Sig Dispense Refill   aspirin 81 MG chewable tablet Chew 81 mg by mouth daily.     Cholecalciferol 1.25 MG (50000 UT) TABS Take 1 tablet by mouth once a week. 12 tablet 0   hydrochlorothiazide (HYDRODIURIL) 12.5 MG tablet Take 1 tablet (12.5 mg total) by mouth daily. 90 tablet 0   losartan (COZAAR) 25 MG tablet Take 1 tablet (25 mg total) by mouth daily. 90 tablet 0   metoprolol tartrate (LOPRESSOR) 25 MG tablet Take 1 tablet (25 mg total) by mouth 2 (two) times daily. 180 tablet 0   sertraline (ZOLOFT) 25 MG tablet Take 1 tablet (25 mg total) by mouth daily. (Patient not taking: Reported on 01/05/2021) 30 tablet 0   No current facility-administered medications on file prior to visit.    Allergies  Allergen Reactions   Codeine Other (See Comments)    unknown unknown   Claritin [Loratadine] Anxiety    Social History   Socioeconomic History   Marital status: Single    Spouse name: Not on file   Number of children: Not on file   Years of education: Not on file   Highest education level: Not on file  Occupational History   Not on file  Tobacco Use   Smoking status: Never   Smokeless tobacco: Never  Vaping Use   Vaping Use: Never used  Substance and Sexual Activity   Alcohol use: Yes    Comment: Occasional    Drug use: No   Sexual activity: Not Currently  Other Topics Concern   Not on file  Social History Narrative   Not on file   Social Determinants of Health    Financial Resource Strain: Not on file  Food Insecurity: Not on file  Transportation Needs: Not on file  Physical Activity: Not on file  Stress: Not on file  Social Connections: Not on file  Intimate Partner Violence: Not on file    Family History  Family history unknown: Yes    No past surgical history on file.  ROS: Review of Systems Negative except as stated above  PHYSICAL EXAM: There were no vitals taken for this visit.  Physical Exam  {male adult master:310786} {male adult master:310785}     Latest Ref Rng & Units 04/07/2021    3:35 PM 01/05/2021    9:47 AM 12/18/2020    4:16 AM  CMP  Glucose 70 - 99 mg/dL 96   82   83    BUN 6 - 24 mg/dL '18   19   11    '$ Creatinine 0.76 - 1.27 mg/dL 1.20   1.10   1.01    Sodium 134 - 144 mmol/L 145   140   141    Potassium 3.5 - 5.2 mmol/L 4.0   4.5   4.5    Chloride 96 - 106 mmol/L 100   98   106    CO2 20 - 29 mmol/L '20   21   22    '$ Calcium  8.7 - 10.2 mg/dL 10.2   9.8   9.5     Lipid Panel     Component Value Date/Time   CHOL 223 (H) 06/29/2020 1635   TRIG 211 (H) 06/29/2020 1635   HDL 44 06/29/2020 1635   CHOLHDL 5.1 (H) 06/29/2020 1635   LDLCALC 141 (H) 06/29/2020 1635    CBC    Component Value Date/Time   WBC 6.6 06/29/2020 1635   WBC 13.5 (H) 05/30/2020 1214   RBC 6.15 (H) 06/29/2020 1635   RBC 6.13 (H) 05/30/2020 1214   HGB 18.2 (H) 06/29/2020 1635   HCT 53.2 (H) 06/29/2020 1635   PLT 231 06/29/2020 1635   MCV 87 06/29/2020 1635   MCH 29.6 06/29/2020 1635   MCH 30.0 05/30/2020 1214   MCHC 34.2 06/29/2020 1635   MCHC 35.0 05/30/2020 1214   RDW 12.5 06/29/2020 1635   LYMPHSABS 2.2 05/30/2020 1214   MONOABS 0.6 05/30/2020 1214   EOSABS 0.0 05/30/2020 1214   BASOSABS 0.0 05/30/2020 1214    ASSESSMENT AND PLAN:  There are no diagnoses linked to this encounter.   Patient was given the opportunity to ask questions.  Patient verbalized understanding of the plan and was able to repeat key elements  of the plan. Patient was given clear instructions to go to Emergency Department or return to medical center if symptoms don't improve, worsen, or new problems develop.The patient verbalized understanding.   No orders of the defined types were placed in this encounter.    Requested Prescriptions    No prescriptions requested or ordered in this encounter    No follow-ups on file.  Camillia Herter, NP

## 2021-07-05 ENCOUNTER — Other Ambulatory Visit: Payer: Self-pay | Admitting: Family

## 2021-07-05 DIAGNOSIS — I10 Essential (primary) hypertension: Secondary | ICD-10-CM

## 2021-07-08 ENCOUNTER — Ambulatory Visit: Payer: Medicaid Other | Admitting: Family

## 2021-07-08 DIAGNOSIS — I10 Essential (primary) hypertension: Secondary | ICD-10-CM

## 2021-07-19 ENCOUNTER — Encounter: Payer: Self-pay | Admitting: Family

## 2021-08-09 ENCOUNTER — Other Ambulatory Visit: Payer: Self-pay | Admitting: Family

## 2021-08-09 DIAGNOSIS — I10 Essential (primary) hypertension: Secondary | ICD-10-CM

## 2021-08-17 ENCOUNTER — Ambulatory Visit: Payer: Self-pay | Admitting: Family

## 2021-08-31 NOTE — Progress Notes (Signed)
Patient ID: Cory Frazier, male    DOB: [birth year]  MRN: [id number]  CC: Hypertension Follow-Up  Subjective: Cory Frazier is a 46 y.o. male who presents for hypertension follow-up.  His concerns today include:  Hypertension follow-up: 04/07/2021: - Continue Metoprolol Tartrate, Hydrochlorothiazide, and Losartan as prescribed.   09/08/2021: Doing well on current regimen. No side effects. No issues/concerns. Reports has noticed blood pressures higher than normal on several days but does not have blood pressure log for provider review. Reports he does over check blood pressure sometimes.   2. Anxiety depression follow-up: 04/07/2021: - Patient reports not ready to begin previously prescribed Sertraline.  09/08/2021: Reports still not taking Sertraline. Reports he feels improved without medication. Reports does feel he has panic attacks sometimes as if clothes are sticking to him. Not ready for referral to Psychiatry.   3. Vitamin D follow-up: Reports he did not take prescribed vitamin D supplements from previously.    Patient Active Problem List   Diagnosis Date Noted   Vitamin D deficiency 06/30/2020     Current Outpatient Medications on File Prior to Visit  Medication Sig Dispense Refill   aspirin 81 MG chewable tablet Chew 81 mg by mouth daily.     Cholecalciferol 1.25 MG (50000 UT) TABS Take 1 tablet by mouth once a week. 12 tablet 0   sertraline (ZOLOFT) 25 MG tablet Take 1 tablet (25 mg total) by mouth daily. (Patient not taking: Reported on 01/05/2021) 30 tablet 0   No current facility-administered medications on file prior to visit.    Allergies  Allergen Reactions   Codeine Other (See Comments)    unknown unknown   Claritin [Loratadine] Anxiety    Social History   Socioeconomic History   Marital status: Single    Spouse name: Not on file   Number of children: Not on file   Years of education: Not on file   Highest education level: Not on file   Occupational History   Not on file  Tobacco Use   Smoking status: Never    Passive exposure: Never   Smokeless tobacco: Never  Vaping Use   Vaping Use: Never used  Substance and Sexual Activity   Alcohol use: Yes    Comment: Occasional    Drug use: No   Sexual activity: Not Currently  Other Topics Concern   Not on file  Social History Narrative   Not on file   Social Determinants of Health   Financial Resource Strain: Not on file  Food Insecurity: Not on file  Transportation Needs: Not on file  Physical Activity: Not on file  Stress: Not on file  Social Connections: Not on file  Intimate Partner Violence: Not on file    Family History  Family history unknown: Yes    History reviewed. No pertinent surgical history.  ROS: Review of Systems Negative except as stated above  PHYSICAL EXAM: BP 120/89 (BP Location: Left Arm, Patient Position: Sitting, Cuff Size: Large)   Pulse 83   Temp 98.3 F (36.8 C)   Resp 16   Ht 5' 5.98" (1.676 m)   Wt 234 lb (106.1 kg)   SpO2 98%   BMI 37.79 kg/m   Physical Exam HENT:     Head: Normocephalic and atraumatic.  Eyes:     Extraocular Movements: Extraocular movements intact.     Conjunctiva/sclera: Conjunctivae normal.     Pupils: Pupils are equal, round, and reactive to light.  Cardiovascular:     Rate  and Rhythm: Normal rate and regular rhythm.     Pulses: Normal pulses.     Heart sounds: Normal heart sounds.  Pulmonary:     Effort: Pulmonary effort is normal.     Breath sounds: Normal breath sounds.  Musculoskeletal:     Cervical back: Normal range of motion and neck supple.  Neurological:     General: No focal deficit present.     Mental Status: He is alert and oriented to person, place, and time.  Psychiatric:        Mood and Affect: Mood normal.        Behavior: Behavior normal.     ASSESSMENT AND PLAN: 1. Essential (primary) hypertension - Continue Metoprolol Tartrate, Hydrochlorothiazide, and Losartan  as prescribed.  - Counseled on blood pressure goal of less than 130/80, low-sodium, DASH diet, medication compliance, and 150 minutes of moderate intensity exercise per week as tolerated. Counseled on medication adherence and adverse effects. - Discussed with patient that his blood pressure is at goal and no need to adjust blood pressure medications. Discussed for him to make a blood pressure log and record blood pressure readings with the date and bring for my review within 2 to 4 weeks. He should bring home blood pressure monitor to next appointment as well. Patient agreeable.  - metoprolol tartrate (LOPRESSOR) 25 MG tablet; Take 1 tablet (25 mg total) by mouth 2 (two) times daily.  Dispense: 60 tablet; Refill: 3 - hydrochlorothiazide (HYDRODIURIL) 12.5 MG tablet; Take 1 tablet (12.5 mg total) by mouth daily.  Dispense: 30 tablet; Refill: 3 - losartan (COZAAR) 25 MG tablet; Take 1 tablet (25 mg total) by mouth daily.  Dispense: 30 tablet; Refill: 3  2. Anxiety and depression - Patient denies thoughts of self-harm, suicidal ideations, homicidal ideations. - Patient reports he doesn't feel he needs Sertraline therefore he doesn't take it. - Patient declined referral to Psychiatry.  - Follow-up with primary provider as scheduled.   3. Vitamin D deficiency - Update vitamin D screening.  - Vitamin D, 25-hydroxy    Patient was given the opportunity to ask questions.  Patient verbalized understanding of the plan and was able to repeat key elements of the plan. Patient was given clear instructions to go to Emergency Department or return to medical center if symptoms don't improve, worsen, or new problems develop.The patient verbalized understanding.   Orders Placed This Encounter  Procedures   Vitamin D, 25-hydroxy     Requested Prescriptions   Signed Prescriptions Disp Refills   metoprolol tartrate (LOPRESSOR) 25 MG tablet 60 tablet 3    Sig: Take 1 tablet (25 mg total) by mouth 2 (two)  times daily.   hydrochlorothiazide (HYDRODIURIL) 12.5 MG tablet 30 tablet 3    Sig: Take 1 tablet (12.5 mg total) by mouth daily.   losartan (COZAAR) 25 MG tablet 30 tablet 3    Sig: Take 1 tablet (25 mg total) by mouth daily.    Return in about 4 months (around 01/08/2022) for Follow-Up or next available HTN/physical.  Tomasz Steeves Zachery Dauer, NP

## 2021-09-08 ENCOUNTER — Ambulatory Visit (INDEPENDENT_AMBULATORY_CARE_PROVIDER_SITE_OTHER): Payer: Self-pay | Admitting: Family

## 2021-09-08 ENCOUNTER — Encounter: Payer: Self-pay | Admitting: Family

## 2021-09-08 VITALS — BP 120/89 | HR 83 | Temp 98.3°F | Resp 16 | Ht 65.98 in | Wt 234.0 lb

## 2021-09-08 DIAGNOSIS — I10 Essential (primary) hypertension: Secondary | ICD-10-CM

## 2021-09-08 DIAGNOSIS — E559 Vitamin D deficiency, unspecified: Secondary | ICD-10-CM

## 2021-09-08 DIAGNOSIS — F419 Anxiety disorder, unspecified: Secondary | ICD-10-CM

## 2021-09-08 DIAGNOSIS — F32A Depression, unspecified: Secondary | ICD-10-CM

## 2021-09-08 MED ORDER — LOSARTAN POTASSIUM 25 MG PO TABS: 25.0000 mg | ORAL_TABLET | Freq: Every day | ORAL | 3 refills | Status: DC

## 2021-09-08 MED ORDER — METOPROLOL TARTRATE 25 MG PO TABS: 25.0000 mg | ORAL_TABLET | Freq: Two times a day (BID) | ORAL | 3 refills | Status: DC

## 2021-09-08 MED ORDER — HYDROCHLOROTHIAZIDE 12.5 MG PO TABS: 12.5000 mg | ORAL_TABLET | Freq: Every day | ORAL | 3 refills | Status: DC

## 2021-09-08 NOTE — Patient Instructions (Signed)
Hypertension, Adult High blood pressure (hypertension) is when the force of blood pumping through the arteries is too strong. The arteries are the blood vessels that carry blood from the heart throughout the body. Hypertension forces the heart to work harder to pump blood and may cause arteries to become narrow or stiff. Untreated or uncontrolled hypertension can lead to a heart attack, heart failure, a stroke, kidney disease, and other problems. A blood pressure reading consists of a higher number over a lower number. Ideally, your blood pressure should be below 120/80. The first ("top") number is called the systolic pressure. It is a measure of the pressure in your arteries as your heart beats. The second ("bottom") number is called the diastolic pressure. It is a measure of the pressure in your arteries as the heart relaxes. What are the causes? The exact cause of this condition is not known. There are some conditions that result in high blood pressure. What increases the risk? Certain factors may make you more likely to develop high blood pressure. Some of these risk factors are under your control, including: Smoking. Not getting enough exercise or physical activity. Being overweight. Having too much fat, sugar, calories, or salt (sodium) in your diet. Drinking too much alcohol. Other risk factors include: Having a personal history of heart disease, diabetes, high cholesterol, or kidney disease. Stress. Having a family history of high blood pressure and high cholesterol. Having obstructive sleep apnea. Age. The risk increases with age. What are the signs or symptoms? High blood pressure may not cause symptoms. Very high blood pressure (hypertensive crisis) may cause: Headache. Fast or irregular heartbeats (palpitations). Shortness of breath. Nosebleed. Nausea and vomiting. Vision changes. Severe chest pain, dizziness, and seizures. How is this diagnosed? This condition is diagnosed by  measuring your blood pressure while you are seated, with your arm resting on a flat surface, your legs uncrossed, and your feet flat on the floor. The cuff of the blood pressure monitor will be placed directly against the skin of your upper arm at the level of your heart. Blood pressure should be measured at least twice using the same arm. Certain conditions can cause a difference in blood pressure between your right and left arms. If you have a high blood pressure reading during one visit or you have normal blood pressure with other risk factors, you may be asked to: Return on a different day to have your blood pressure checked again. Monitor your blood pressure at home for 1 week or longer. If you are diagnosed with hypertension, you may have other blood or imaging tests to help your health care provider understand your overall risk for other conditions. How is this treated? This condition is treated by making healthy lifestyle changes, such as eating healthy foods, exercising more, and reducing your alcohol intake. You may be referred for counseling on a healthy diet and physical activity. Your health care provider may prescribe medicine if lifestyle changes are not enough to get your blood pressure under control and if: Your systolic blood pressure is above 130. Your diastolic blood pressure is above 80. Your personal target blood pressure may vary depending on your medical conditions, your age, and other factors. Follow these instructions at home: Eating and drinking  Eat a diet that is high in fiber and potassium, and low in sodium, added sugar, and fat. An example of this eating plan is called the DASH diet. DASH stands for Dietary Approaches to Stop Hypertension. To eat this way: Eat   plenty of fresh fruits and vegetables. Try to fill one half of your plate at each meal with fruits and vegetables. Eat whole grains, such as whole-wheat pasta, brown rice, or whole-grain bread. Fill about one  fourth of your plate with whole grains. Eat or drink low-fat dairy products, such as skim milk or low-fat yogurt. Avoid fatty cuts of meat, processed or cured meats, and poultry with skin. Fill about one fourth of your plate with lean proteins, such as fish, chicken without skin, beans, eggs, or tofu. Avoid pre-made and processed foods. These tend to be higher in sodium, added sugar, and fat. Reduce your daily sodium intake. Many people with hypertension should eat less than 1,500 mg of sodium a day. Do not drink alcohol if: Your health care provider tells you not to drink. You are pregnant, may be pregnant, or are planning to become pregnant. If you drink alcohol: Limit how much you have to: 0-1 drink a day for women. 0-2 drinks a day for men. Know how much alcohol is in your drink. In the [place] one drink equals one 12 oz bottle of beer (355 mL), one 5 oz glass of wine (148 mL), or one 1 oz glass of hard liquor (44 mL). Lifestyle  Work with your health care provider to maintain a healthy body weight or to lose weight. Ask what an ideal weight is for you. Get at least 30 minutes of exercise that causes your heart to beat faster (aerobic exercise) most days of the week. Activities may include walking, swimming, or biking. Include exercise to strengthen your muscles (resistance exercise), such as Pilates or lifting weights, as part of your weekly exercise routine. Try to do these types of exercises for 30 minutes at least 3 days a week. Do not use any products that contain nicotine or tobacco. These products include cigarettes, chewing tobacco, and vaping devices, such as e-cigarettes. If you need help quitting, ask your health care provider. Monitor your blood pressure at home as told by your health care provider. Keep all follow-up visits. This is important. Medicines Take over-the-counter and prescription medicines only as told by your health care provider. Follow directions carefully. Blood  pressure medicines must be taken as prescribed. Do not skip doses of blood pressure medicine. Doing this puts you at risk for problems and can make the medicine less effective. Ask your health care provider about side effects or reactions to medicines that you should watch for. Contact a health care provider if you: Think you are having a reaction to a medicine you are taking. Have headaches that keep coming back (recurring). Feel dizzy. Have swelling in your ankles. Have trouble with your vision. Get help right away if you: Develop a severe headache or confusion. Have unusual weakness or numbness. Feel faint. Have severe pain in your chest or abdomen. Vomit repeatedly. Have trouble breathing. These symptoms may be an emergency. Get help right away. Call 911. Do not wait to see if the symptoms will go away. Do not drive yourself to the hospital. Summary Hypertension is when the force of blood pumping through your arteries is too strong. If this condition is not controlled, it may put you at risk for serious complications. Your personal target blood pressure may vary depending on your medical conditions, your age, and other factors. For most people, a normal blood pressure is less than 120/80. Hypertension is treated with lifestyle changes, medicines, or a combination of both. Lifestyle changes include losing weight, eating a healthy,   low-sodium diet, exercising more, and limiting alcohol. This information is not intended to replace advice given to you by your health care provider. Make sure you discuss any questions you have with your health care provider. Document Revised: 01/12/2021 Document Reviewed: 01/12/2021 Elsevier Patient Education  2023 [employer]  

## 2021-09-08 NOTE — Progress Notes (Signed)
.  Pt presents for hypertension f/u   

## 2021-09-09 ENCOUNTER — Other Ambulatory Visit: Payer: Self-pay | Admitting: Family

## 2021-09-09 DIAGNOSIS — E559 Vitamin D deficiency, unspecified: Secondary | ICD-10-CM

## 2021-09-09 LAB — VITAMIN D 25 HYDROXY (VIT D DEFICIENCY, FRACTURES): Vit D, 25-Hydroxy: 18.9 ng/mL — ABNORMAL LOW (ref 30.0–100.0)

## 2021-09-09 MED ORDER — CHOLECALCIFEROL 1.25 MG (50000 UT) PO TABS: 1.0000 | ORAL_TABLET | ORAL | 2 refills | Status: AC

## 2021-10-20 ENCOUNTER — Other Ambulatory Visit: Payer: Self-pay

## 2021-10-20 DIAGNOSIS — I10 Essential (primary) hypertension: Secondary | ICD-10-CM

## 2021-10-20 MED ORDER — METOPROLOL TARTRATE 25 MG PO TABS: 25.0000 mg | ORAL_TABLET | Freq: Two times a day (BID) | ORAL | 0 refills | Status: DC

## 2021-12-26 NOTE — Progress Notes (Deleted)
Patient ID: Cory Frazier, male    DOB: 20-Aug-1975  MRN: [id number]  CC: Annual Physical Exam  Subjective: Cory Frazier is a 46 y.o. male who presents for annual physical exam.   His concerns today include:  HTN - Metoprolol, HCTZ, Losartan    Patient Active Problem List   Diagnosis Date Noted   Vitamin D deficiency 06/30/2020     Current Outpatient Medications on File Prior to Visit  Medication Sig Dispense Refill   aspirin 81 MG chewable tablet Chew 81 mg by mouth daily.     hydrochlorothiazide (HYDRODIURIL) 12.5 MG tablet Take 1 tablet (12.5 mg total) by mouth daily. 30 tablet 3   losartan (COZAAR) 25 MG tablet Take 1 tablet (25 mg total) by mouth daily. 30 tablet 3   metoprolol tartrate (LOPRESSOR) 25 MG tablet Take 1 tablet (25 mg total) by mouth 2 (two) times daily. 180 tablet 0   sertraline (ZOLOFT) 25 MG tablet Take 1 tablet (25 mg total) by mouth daily. (Patient not taking: Reported on 01/05/2021) 30 tablet 0   No current facility-administered medications on file prior to visit.    Allergies  Allergen Reactions   Codeine Other (See Comments)    unknown unknown   Claritin [Loratadine] Anxiety    Social History   Socioeconomic History   Marital status: Single    Spouse name: Not on file   Number of children: Not on file   Years of education: Not on file   Highest education level: Not on file  Occupational History   Not on file  Tobacco Use   Smoking status: Never    Passive exposure: Never   Smokeless tobacco: Never  Vaping Use   Vaping Use: Never used  Substance and Sexual Activity   Alcohol use: Yes    Comment: Occasional    Drug use: No   Sexual activity: Not Currently  Other Topics Concern   Not on file  Social History Narrative   Not on file   Social Determinants of Health   Financial Resource Strain: Not on file  Food Insecurity: Not on file  Transportation Needs: Not on file  Physical Activity: Not on file  Stress: Not on file   Social Connections: Not on file  Intimate Partner Violence: Not on file    Family History  Family history unknown: Yes    No past surgical history on file.  ROS: Review of Systems Negative except as stated above  PHYSICAL EXAM: There were no vitals taken for this visit.  Physical Exam  {male adult master:310786} {male adult master:310785}     Latest Ref Rng & Units 04/07/2021    3:35 PM 01/05/2021    9:47 AM 12/18/2020    4:16 AM  CMP  Glucose 70 - 99 mg/dL 96  82  83   BUN 6 - 24 mg/dL '18  19  11   '$ Creatinine 0.76 - 1.27 mg/dL 1.20  1.10  1.01   Sodium 134 - 144 mmol/L 145  140  141   Potassium 3.5 - 5.2 mmol/L 4.0  4.5  4.5   Chloride 96 - 106 mmol/L 100  98  106   CO2 20 - 29 mmol/L '20  21  22   '$ Calcium 8.7 - 10.2 mg/dL 10.2  9.8  9.5    Lipid Panel     Component Value Date/Time   CHOL 223 (H) 06/29/2020 1635   TRIG 211 (H) 06/29/2020 1635   HDL 44 06/29/2020  1635   CHOLHDL 5.1 (H) 06/29/2020 1635   LDLCALC 141 (H) 06/29/2020 1635    CBC    Component Value Date/Time   WBC 6.6 06/29/2020 1635   WBC 13.5 (H) 05/30/2020 1214   RBC 6.15 (H) 06/29/2020 1635   RBC 6.13 (H) 05/30/2020 1214   HGB 18.2 (H) 06/29/2020 1635   HCT 53.2 (H) 06/29/2020 1635   PLT 231 06/29/2020 1635   MCV 87 06/29/2020 1635   MCH 29.6 06/29/2020 1635   MCH 30.0 05/30/2020 1214   MCHC 34.2 06/29/2020 1635   MCHC 35.0 05/30/2020 1214   RDW 12.5 06/29/2020 1635   LYMPHSABS 2.2 05/30/2020 1214   MONOABS 0.6 05/30/2020 1214   EOSABS 0.0 05/30/2020 1214   BASOSABS 0.0 05/30/2020 1214    ASSESSMENT AND PLAN:  There are no diagnoses linked to this encounter.   Patient was given the opportunity to ask questions.  Patient verbalized understanding of the plan and was able to repeat key elements of the plan. Patient was given clear instructions to go to Emergency Department or return to medical center if symptoms don't improve, worsen, or new problems develop.The patient  verbalized understanding.   No orders of the defined types were placed in this encounter.    Requested Prescriptions    No prescriptions requested or ordered in this encounter    No follow-ups on file.  Camillia Herter, NP

## 2022-01-03 ENCOUNTER — Ambulatory Visit: Payer: Self-pay | Admitting: *Deleted

## 2022-01-03 NOTE — Telephone Encounter (Signed)
Reason for Disposition  Palpitations are a chronic symptom (recurrent or ongoing AND present > 4 weeks)  Answer Assessment - Initial Assessment Questions 1. DESCRIPTION: "Please describe your heart rate or heartbeat that you are having" (e.g., fast/slow, regular/irregular, skipped or extra beats, "palpitations")     Happened last year too.    My heart feels like it's racing.   For 2 weeks when laying down I could feel my heart.   I have anxiety real bad too.    I was nervous trying to sleep because my heart was fast. My BP is usually 130/90 or lower.  Last week my BP went up high in the middle of the night 150/116.    It made it nervous.    I got panicky.    I've been fine since.   No shortness of breath.   I've had chest pain since my 20's.     2. ONSET: "When did it start?" (Minutes, hours or days)      2 weeks ago this happened.   I've been fine since.   3. DURATION: "How long does it last" (e.g., seconds, minutes, hours)     Not asked 4. PATTERN "Does it come and go, or has it been constant since it started?"  "Does it get worse with exertion?"   "Are you feeling it now?"     Intermittent 5. TAP: "Using your hand, can you tap out what you are feeling on a chair or table in front of you, so that I can hear?" (Note: not all patients can do this)       Not asked 6. HEART RATE: "Can you tell me your heart rate?" "How many beats in 15 seconds?"  (Note: not all patients can do this)       Not asked  7. RECURRENT SYMPTOM: "Have you ever had this before?" If Yes, ask: "When was the last time?" and "What happened that time?"      This   8. CAUSE: "What do you think is causing the palpitations?"     I have bad anxiety.    9. CARDIAC HISTORY: "Do you have any history of heart disease?" (e.g., heart attack, angina, bypass surgery, angioplasty, arrhythmia)      I've been having chest pain on and off since my 20's.  10. OTHER SYMPTOMS: "Do you have any other symptoms?" (e.g., dizziness, chest pain,  sweating, difficulty breathing)       No shortness of breath, breaking out into sweats.  Chest pain is not different than his usual. 11. PREGNANCY: "Is there any chance you are pregnant?" "When was your last menstrual period?"       N/A  Protocols used: Heart Rate and Heartbeat Questions-A-AH

## 2022-01-03 NOTE — Telephone Encounter (Signed)
  Chief Complaint: heart feels like it's going fast when I'm laying down.    I have anxiety real bad. Symptoms: above Frequency: Happened 2 wks ago Pertinent Negatives: Patient denies shortness of breath or this being any different. Disposition: '[]'$ ED /'[]'$ Urgent Care (no appt availability in office) / '[x]'$ Appointment(In office/virtual)/ '[]'$  [place] [facility]/ '[]'$ Home Care/ '[]'$ Refused Recommended Disposition /'[]'$ [place] [facility]/ '[]'$  Follow-up with PCP Additional Notes: He has an appt with Durene Fruits, NP coming up.  Instructed to go to the ED if his symptoms get worse or are different than the chest pain he has been having since his 20's.

## 2022-01-04 ENCOUNTER — Encounter: Payer: Self-pay | Admitting: Family

## 2022-01-04 DIAGNOSIS — Z13220 Encounter for screening for lipoid disorders: Secondary | ICD-10-CM

## 2022-01-04 DIAGNOSIS — Z0000 Encounter for general adult medical examination without abnormal findings: Secondary | ICD-10-CM

## 2022-01-04 DIAGNOSIS — Z131 Encounter for screening for diabetes mellitus: Secondary | ICD-10-CM

## 2022-01-04 DIAGNOSIS — I10 Essential (primary) hypertension: Secondary | ICD-10-CM

## 2022-01-04 DIAGNOSIS — Z130 Encounter for screening for diseases of the blood and blood-forming organs and certain disorders involving the immune mechanism: Secondary | ICD-10-CM

## 2022-01-04 DIAGNOSIS — Z1329 Encounter for screening for other suspected endocrine disorder: Secondary | ICD-10-CM

## 2022-01-04 DIAGNOSIS — Z13228 Encounter for screening for other metabolic disorders: Secondary | ICD-10-CM

## 2022-01-06 ENCOUNTER — Other Ambulatory Visit: Payer: Self-pay

## 2022-01-06 DIAGNOSIS — I10 Essential (primary) hypertension: Secondary | ICD-10-CM

## 2022-01-06 MED ORDER — LOSARTAN POTASSIUM 25 MG PO TABS: 25.0000 mg | ORAL_TABLET | Freq: Every day | ORAL | 0 refills | Status: DC

## 2022-01-06 MED ORDER — METOPROLOL TARTRATE 25 MG PO TABS: 25.0000 mg | ORAL_TABLET | Freq: Two times a day (BID) | ORAL | 0 refills | Status: DC

## 2022-01-06 MED ORDER — HYDROCHLOROTHIAZIDE 12.5 MG PO TABS: 12.5000 mg | ORAL_TABLET | Freq: Every day | ORAL | 0 refills | Status: DC

## 2022-02-01 ENCOUNTER — Ambulatory Visit: Payer: Self-pay

## 2022-02-01 NOTE — Telephone Encounter (Signed)
  Chief Complaint: HTN Symptoms: elevated BP, having anxiety at times Frequency: several days  Pertinent Negatives: NA Disposition: '[]'$ ED /'[]'$ Urgent Care (no appt availability in office) / '[x]'$ Appointment(In office/virtual)/ '[]'$  [place] [facility]/ '[]'$ Home Care/ '[]'$ Refused Recommended Disposition /'[]'$ [facility]/ '[]'$  Follow-up with PCP Additional Notes: pt states that at times he has high anxiety and BP will increase. Pt still taking BP meds but was wondering if he needed to start Zoloft or not. Pt has appt 02/08/22 for BP. Advised him if he wanted to try it he could but since he has appt next week he could wait and see what PCP says since it has been over 1 year since prescribed. Pt states he would keep log and just continue with his current meds and just come in next week.  Reason for Disposition  [3] Systolic BP  >= 903 OR Diastolic >= 80 AND [0] taking BP medications  Answer Assessment - Initial Assessment Questions 1. BLOOD PRESSURE: "What is the blood pressure?" "Did you take at least two measurements 5 minutes apart?"     148/85, 155/100,  2. ONSET: "When did you take your blood pressure?"     Ongoing for several days  3. HOW: "How did you take your blood pressure?" (e.g., automatic home BP monitor, visiting nurse)     Home BPs 5. MEDICINES: "Are you taking any medicines for blood pressure?" "Have you missed any doses recently?"     yes 6. OTHER SYMPTOMS: "Do you have any symptoms?" (e.g., blurred vision, chest pain, difficulty breathing, headache, weakness)     Having periods of anxiety  Protocols used: Blood Pressure - High-A-AH

## 2022-02-02 NOTE — Progress Notes (Signed)
Patient ID: Cory Frazier, male    DOB: 1975/10/04  MRN: [id number]  CC: Blood Pressure Check  Subjective: Cory Frazier is a 46 y.o. male who presents for blood pressure check.   His concerns today include:  09/08/2021 [facility] per my note: - Continue Metoprolol Tartrate, Hydrochlorothiazide, and Losartan as prescribed.   - Reports still not taking Sertraline. Reports he feels improved without medication. Reports does feel he has panic attacks sometimes as if clothes are sticking to him. Not ready for referral to Psychiatry.    02/01/2022 per triage RN note: Chief Complaint: HTN Symptoms: elevated BP, having anxiety at times Frequency: several days  Pertinent Negatives: NA Disposition: _0 ED /_1 Urgent Care (no appt availability in office) / _2 Appointment(In office/virtual)/ _3  [facility]/ _4 Home Care/ _5 Refused Recommended Disposition /_6 [facility] [facility] [facility]/ _7  Follow-up with PCP Additional Notes: pt states that at times he has high anxiety and BP will increase. Pt still taking BP meds but was wondering if he needed to start Zoloft or not. Pt has appt 02/08/22 for BP. Advised him if he wanted to try it he could but since he has appt next week he could wait and see what PCP says since it has been over 1 year since prescribed. Pt states he would keep log and just continue with his current meds and just come in next week.   Reason for Disposition  [3] Systolic BP  >= 007 OR Diastolic >= 80 AND [6] taking BP medications  Answer Assessment - Initial Assessment Questions 1. BLOOD PRESSURE: "What is the blood pressure?" "Did you take at least two measurements 5 minutes apart?"     148/85, 155/100,  2. ONSET: "When did you take your blood pressure?"     Ongoing for several days  3. HOW: "How did you take your blood pressure?" (e.g., automatic home BP monitor, visiting nurse)     Home BPs 5. MEDICINES: "Are you taking any medicines for blood pressure?" "Have you  missed any doses recently?"     yes 6. OTHER SYMPTOMS: "Do you have any symptoms?" (e.g., blurred vision, chest pain, difficulty breathing, headache, weakness)     Having periods of anxiety  Today's visit 02/08/2022: - Reports for a while wasn't checking blood pressures at home. States one day wasn't feeling well and checked blood pressure at that time and was 150's/110's. Reports after that he began checking his blood pressures more routinely which remained above goal. However, on last week blood pressures began to come down to mostly 130's/70's-80's. Did have an instance where diastolic pressure was 22'Q after getting out of the shower. He is taking blood pressure medications as prescribed. Endorses chest tightness and sensation of heart pounding in stomach when lying down. Family history of heart-related issues.  - Reports he never took Zoloft due to concerns of side effects. Would like to trial Hydroxyzine instead. Reports taking Xanax in the past helped. Reports panic attacks, feeling of something bad is going to happen to him, and sensation of nerves twitching. States anxiety interfering with his friendships. Reports his friends drive from hours away to come and sit with him because he doesn't feel comfortable sitting alone at home. States he feels that he is getting on his friends nerves. He is ready for counseling services.  - Wants Vitamin D checked. Reports when he was taking prescribed Vitamin D supplement caused jitters.  - No further issues/concerns today.   Patient Active Problem List   Diagnosis Date  Noted   Vitamin D deficiency 06/30/2020     Current Outpatient Medications on File Prior to Visit  Medication Sig Dispense Refill   aspirin 81 MG chewable tablet Chew 81 mg by mouth daily.     hydrochlorothiazide (HYDRODIURIL) 12.5 MG tablet Take 1 tablet (12.5 mg total) by mouth daily. 90 tablet 0   losartan (COZAAR) 25 MG tablet Take 1 tablet (25 mg total) by mouth daily. 90 tablet 0    metoprolol tartrate (LOPRESSOR) 25 MG tablet Take 1 tablet (25 mg total) by mouth 2 (two) times daily. 180 tablet 0   No current facility-administered medications on file prior to visit.    Allergies  Allergen Reactions   Codeine Other (See Comments)    unknown unknown   Claritin [Loratadine] Anxiety    Social History   Socioeconomic History   Marital status: Single    Spouse name: Not on file   Number of children: Not on file   Years of education: Not on file   Highest education level: Not on file  Occupational History   Not on file  Tobacco Use   Smoking status: Never    Passive exposure: Never   Smokeless tobacco: Never  Vaping Use   Vaping Use: Never used  Substance and Sexual Activity   Alcohol use: Yes    Comment: Occasional    Drug use: No   Sexual activity: Not Currently  Other Topics Concern   Not on file  Social History Narrative   Not on file   Social Determinants of Health   Financial Resource Strain: Not on file  Food Insecurity: Not on file  Transportation Needs: Not on file  Physical Activity: Not on file  Stress: Not on file  Social Connections: Not on file  Intimate Partner Violence: Not on file    Family History  Family history unknown: Yes    No past surgical history on file.  ROS: Review of Systems Negative except as stated above  PHYSICAL EXAM: Temp 98.3 F (36.8 C)   Resp 16   Ht 5' 5.98" (1.676 m)   Wt 230 lb (104.3 kg)   SpO2 96%   BMI 37.14 kg/m   Physical Exam HENT:     Head: Normocephalic and atraumatic.  Eyes:     Extraocular Movements: Extraocular movements intact.     Conjunctiva/sclera: Conjunctivae normal.     Pupils: Pupils are equal, round, and reactive to light.  Cardiovascular:     Rate and Rhythm: Normal rate and regular rhythm.     Pulses: Normal pulses.     Heart sounds: Normal heart sounds.  Pulmonary:     Effort: Pulmonary effort is normal.     Breath sounds: Normal breath sounds.   Musculoskeletal:     Cervical back: Normal range of motion and neck supple.  Neurological:     General: No focal deficit present.     Mental Status: He is alert and oriented to person, place, and time.  Psychiatric:        Mood and Affect: Mood is anxious.     ASSESSMENT AND PLAN: 1. Primary hypertension 2. Chest tightness - Patient today in office without cardiopulmonary distress and no chest tightness. Discussed with patient chest tightness may be secondary to anxiety depression (see #2). - Continue Losartan, Hydrochlorothiazide, and Metoprolol Tartrate as prescribed. No refills needed as of present.  - Counseled on blood pressure goal of less than 130/80, low-sodium, DASH diet, medication compliance, and 150 minutes  of moderate intensity exercise per week as tolerated. Counseled on medication adherence and adverse effects. - Update CMP.  - Referral to Cardiology for further evaluation and management.  - CMP14+EGFR - Ambulatory referral to Cardiology  3. Anxiety and depression 4. Panic attacks 5. Sense of impending doom - Patient denies thoughts of self-harm, suicidal ideations, homicidal ideations. - Sertraline discontinued per patient preference.  - Hydroxyzine as prescribed. Counseled on medication adherence/adverse effects.  - Referral to Psychiatry for further evaluation and management. During the interim patient to follow-up with me as needed.  - hydrOXYzine (VISTARIL) 25 MG capsule; Take 1 capsule (25 mg total) by mouth every 8 (eight) hours as needed.  Dispense: 30 capsule; Refill: 1 - Ambulatory referral to Psychiatry  6. Vitamin D deficiency - Routine lab. - Vitamin D, 25-hydroxy   Patient was given the opportunity to ask questions.  Patient verbalized understanding of the plan and was able to repeat key elements of the plan. Patient was given clear instructions to go to Emergency Department or return to medical center if symptoms don't improve, worsen, or new  problems develop.The patient verbalized understanding.   Orders Placed This Encounter  Procedures   CMP14+EGFR   Vitamin D, 25-hydroxy   Ambulatory referral to Psychiatry   Ambulatory referral to Cardiology     Requested Prescriptions   Signed Prescriptions Disp Refills   hydrOXYzine (VISTARIL) 25 MG capsule 30 capsule 1    Sig: Take 1 capsule (25 mg total) by mouth every 8 (eight) hours as needed.    Follow-up with primary provider as scheduled.  Camillia Herter, NP

## 2022-02-08 ENCOUNTER — Ambulatory Visit (INDEPENDENT_AMBULATORY_CARE_PROVIDER_SITE_OTHER): Payer: Self-pay | Admitting: Family

## 2022-02-08 VITALS — Temp 98.3°F | Resp 16 | Ht 65.98 in | Wt 230.0 lb

## 2022-02-08 DIAGNOSIS — E559 Vitamin D deficiency, unspecified: Secondary | ICD-10-CM

## 2022-02-08 DIAGNOSIS — F419 Anxiety disorder, unspecified: Secondary | ICD-10-CM

## 2022-02-08 DIAGNOSIS — I10 Essential (primary) hypertension: Secondary | ICD-10-CM

## 2022-02-08 DIAGNOSIS — R4589 Other symptoms and signs involving emotional state: Secondary | ICD-10-CM

## 2022-02-08 DIAGNOSIS — F410 Panic disorder [episodic paroxysmal anxiety] without agoraphobia: Secondary | ICD-10-CM

## 2022-02-08 DIAGNOSIS — F32A Depression, unspecified: Secondary | ICD-10-CM

## 2022-02-08 DIAGNOSIS — R0789 Other chest pain: Secondary | ICD-10-CM

## 2022-02-08 MED ORDER — HYDROXYZINE PAMOATE 25 MG PO CAPS: 25.0000 mg | ORAL_CAPSULE | Freq: Three times a day (TID) | ORAL | 1 refills | Status: DC | PRN

## 2022-02-08 NOTE — Progress Notes (Signed)
.  Pt presents for blood pressure check   -noticed high readings about month ago, states its increasing anxiety   -never started Zoloft

## 2022-02-09 ENCOUNTER — Ambulatory Visit: Payer: Self-pay | Admitting: *Deleted

## 2022-02-09 LAB — CMP14+EGFR
ALT: 21 IU/L (ref 0–44)
AST: 21 IU/L (ref 0–40)
Albumin/Globulin Ratio: 2 (ref 1.2–2.2)
Albumin: 4.7 g/dL (ref 4.1–5.1)
Alkaline Phosphatase: 61 IU/L (ref 44–121)
BUN/Creatinine Ratio: 18 (ref 9–20)
BUN: 19 mg/dL (ref 6–24)
Bilirubin Total: 0.5 mg/dL (ref 0.0–1.2)
CO2: 24 mmol/L (ref 20–29)
Calcium: 10.4 mg/dL — ABNORMAL HIGH (ref 8.7–10.2)
Chloride: 99 mmol/L (ref 96–106)
Creatinine, Ser: 1.06 mg/dL (ref 0.76–1.27)
Globulin, Total: 2.4 g/dL (ref 1.5–4.5)
Glucose: 82 mg/dL (ref 70–99)
Potassium: 4.3 mmol/L (ref 3.5–5.2)
Sodium: 138 mmol/L (ref 134–144)
Total Protein: 7.1 g/dL (ref 6.0–8.5)
eGFR: 87 mL/min/{1.73_m2} (ref >59)

## 2022-02-09 LAB — VITAMIN D 25 HYDROXY (VIT D DEFICIENCY, FRACTURES): Vit D, 25-Hydroxy: 34.9 ng/mL (ref 30.0–100.0)

## 2022-02-09 NOTE — Telephone Encounter (Signed)
Summary: medication side effects   Pt was prescribed a medication that he is concerned with the side effects / please advise/ pt didn't state name of medication         Chief Complaint: wants to notify PCP he is not going to take hydroxyzine 25 mg due to possible SE. Requesting referral to psychiatrist Symptoms: reports medication prescribed hydroxyzine is antihistamine and when patient takes antihistamines in the past it causes "jittery" feeling and feels it would be counterproductive to start.  Frequency: na Pertinent Negatives: Patient denies no sx now  Disposition: '[]'$ ED /'[]'$ Urgent Care (no appt availability in office) / '[]'$ Appointment(In office/virtual)/ '[]'$  [facility]/ '[]'$ Home Care/ '[]'$ Refused Recommended Disposition /'[]'$ [place] [facility] [facility]/ '[x]'$  Follow-up with PCP Additional Notes:   FYI- requesting referral  psy.     Reason for Disposition  Caller has medicine question only, adult not sick, AND triager answers question  Answer Assessment - Initial Assessment Questions 1. NAME of MEDICINE: "What medicine(s) are you calling about?"     Hydroxyzine 25 mg  2. QUESTION: "What is your question?" (e.g., double dose of medicine, side effect)     Wants to notify PCP he will not be taking medication due to possible SE. Hx other antihistamines causes him to feel "jittery".  3. PRESCRIBER: "Who prescribed the medicine?" Reason: if prescribed by specialist, call should be referred to that group.     PCP 4. SYMPTOMS: "Do you have any symptoms?" If Yes, ask: "What symptoms are you having?"  "How bad are the symptoms (e.g., mild, moderate, severe)     None now  5. PREGNANCY:  "Is there any chance that you are pregnant?" "When was your last menstrual period?"     na  Protocols used: Medication Question Call-A-AH

## 2022-02-14 ENCOUNTER — Telehealth: Payer: Self-pay | Admitting: Family

## 2022-02-14 NOTE — Telephone Encounter (Signed)
Patient referred to Psychiatry during appointment 02/08/2022.

## 2022-02-16 NOTE — Telephone Encounter (Signed)
Pt returned call for update on psychiatry referral, tried calling coordinator no answer.

## 2022-02-18 NOTE — Telephone Encounter (Signed)
Cory Sierras do we have an update? Thank you

## 2022-02-18 NOTE — Telephone Encounter (Signed)
Patient checking on the status of referral and would like referral placed in network. Patient also would like a follow up call in regards to where referral was placed.

## 2022-02-21 ENCOUNTER — Ambulatory Visit: Payer: Medicaid Other | Attending: Cardiology | Admitting: Cardiology

## 2022-02-21 ENCOUNTER — Encounter: Payer: Self-pay | Admitting: Cardiology

## 2022-02-21 ENCOUNTER — Ambulatory Visit: Payer: Medicaid Other | Attending: Cardiology

## 2022-02-21 VITALS — BP 142/92 | HR 103 | Ht 66.0 in | Wt 229.0 lb

## 2022-02-21 DIAGNOSIS — R0789 Other chest pain: Secondary | ICD-10-CM | POA: Diagnosis not present

## 2022-02-21 DIAGNOSIS — R002 Palpitations: Secondary | ICD-10-CM

## 2022-02-21 DIAGNOSIS — I10 Essential (primary) hypertension: Secondary | ICD-10-CM | POA: Diagnosis not present

## 2022-02-21 DIAGNOSIS — E669 Obesity, unspecified: Secondary | ICD-10-CM | POA: Diagnosis not present

## 2022-02-21 MED ORDER — LOSARTAN POTASSIUM 50 MG PO TABS: 50.0000 mg | ORAL_TABLET | Freq: Every day | ORAL | 0 refills | Status: DC

## 2022-02-21 NOTE — Progress Notes (Signed)
Patient ID: Cory Frazier, male    DOB: [birth year]  MRN: [id number]  CC: Annual Physical Exam   Subjective: Cory Frazier is a 46 y.o. male who presents for annual physical exam.   His concerns today include:  - Established with Cardiology. Last appointment 02/21/2022. Reports since then doesn't think "double dose" of Losartan is helping with his blood pressure. Also, concerned about a rash that he thinks is caused by Losartan. Reports his next appointment with Cardiology is 15 weeks away. Discussed with patient to notify Cardiology of his concerns and to see if he can be seen sooner.  - Patient states "I feel like I am going to die." "I feel like I am going to have a heart attack." "I am having a panic anxiety attack." He was able to call his friend, Abigail Butts, for emotional support who was sitting in our office lobby. Reports he has an appointment with Psychiatry on 03/07/2022. - Reports feels his stomach muscles are pulling on his chest. Endorses bloating.  - No further issues/concerns.   Patient Active Problem List   Diagnosis Date Noted   Vitamin D deficiency 06/30/2020     Current Outpatient Medications on File Prior to Visit  Medication Sig Dispense Refill   aspirin 81 MG chewable tablet Chew 81 mg by mouth daily.     hydrochlorothiazide (HYDRODIURIL) 12.5 MG tablet Take 1 tablet (12.5 mg total) by mouth daily. 90 tablet 0   losartan (COZAAR) 50 MG tablet Take 1 tablet (50 mg total) by mouth daily. 90 tablet 0   metoprolol tartrate (LOPRESSOR) 25 MG tablet Take 1 tablet (25 mg total) by mouth 2 (two) times daily. 180 tablet 0   No current facility-administered medications on file prior to visit.    Allergies  Allergen Reactions   Codeine Other (See Comments)    unknown unknown   Claritin [Loratadine] Anxiety    Social History   Socioeconomic History   Marital status: Single    Spouse name: Not on file   Number of children: Not on file   Years of education: Not on file    Highest education level: Not on file  Occupational History   Not on file  Tobacco Use   Smoking status: Never    Passive exposure: Never   Smokeless tobacco: Never  Vaping Use   Vaping Use: Never used  Substance and Sexual Activity   Alcohol use: Yes    Comment: Occasional    Drug use: No   Sexual activity: Not Currently  Other Topics Concern   Not on file  Social History Narrative   Not on file   Social Determinants of Health   Financial Resource Strain: Not on file  Food Insecurity: Not on file  Transportation Needs: Not on file  Physical Activity: Not on file  Stress: Not on file  Social Connections: Not on file  Intimate Partner Violence: Not on file    Family History  Family history unknown: Yes    History reviewed. No pertinent surgical history.  ROS: Review of Systems Negative except as stated above  PHYSICAL EXAM: BP (!) 128/91 (BP Location: Left Arm, Patient Position: Sitting, Cuff Size: Large)   Pulse (!) 121   Temp 98.3 F (36.8 C)   Resp 16   Ht 5' 5.98" (1.676 m)   Wt 228 lb (103.4 kg)   SpO2 97%   BMI 36.82 kg/m   Physical Exam HENT:     Head: Normocephalic and atraumatic.  Right Ear: Tympanic membrane, ear canal and external ear normal.     Left Ear: Tympanic membrane, ear canal and external ear normal.     Nose: Nose normal.     Mouth/Throat:     Mouth: Mucous membranes are moist.     Pharynx: Oropharynx is clear.  Eyes:     Extraocular Movements: Extraocular movements intact.     Conjunctiva/sclera: Conjunctivae normal.     Pupils: Pupils are equal, round, and reactive to light.  Cardiovascular:     Rate and Rhythm: Normal rate and regular rhythm.     Pulses: Normal pulses.     Heart sounds: Normal heart sounds.  Pulmonary:     Effort: Pulmonary effort is normal.     Breath sounds: Normal breath sounds.  Abdominal:     General: Bowel sounds are normal.     Palpations: Abdomen is soft.  Genitourinary:    Comments:  Patient declined.  Musculoskeletal:        General: Normal range of motion.     Right shoulder: Normal.     Left shoulder: Normal.     Right upper arm: Normal.     Left upper arm: Normal.     Right elbow: Normal.     Left elbow: Normal.     Right forearm: Normal.     Left forearm: Normal.     Right wrist: Normal.     Left wrist: Normal.     Right hand: Normal.     Left hand: Normal.     Cervical back: Normal, normal range of motion and neck supple.     Thoracic back: Normal.     Lumbar back: Normal.     Right hip: Normal.     Left hip: Normal.     Right upper leg: Normal.     Left upper leg: Normal.     Right knee: Normal.     Left knee: Normal.     Right lower leg: Normal.     Left lower leg: Normal.     Right ankle: Normal.     Left ankle: Normal.     Right foot: Normal.     Left foot: Normal.  Skin:    General: Skin is warm and dry.     Capillary Refill: Capillary refill takes less than 2 seconds.     Findings: Rash present.  Neurological:     General: No focal deficit present.     Mental Status: He is alert and oriented to person, place, and time.  Psychiatric:        Mood and Affect: Mood normal.        Behavior: Behavior normal.      ASSESSMENT AND PLAN: 1. Annual physical exam - Counseled on 150 minutes of exercise per week as tolerated, healthy eating (including decreased daily intake of saturated fats, cholesterol, added sugars, sodium), STI prevention, and routine healthcare maintenance.  2. Screening for iron deficiency anemia - Routine screening.  - CBC  3. Diabetes mellitus screening - Routine screening.  - Hemoglobin A1c  4. Screening cholesterol level - Routine screening.  - Lipid panel  5. Thyroid disorder screen - Routine screening.  - TSH  6. Abdominal bloating - Referral to Gastroenterology for further evaluation/management.  - Ambulatory referral to Gastroenterology   Patient was given the opportunity to ask questions.  Patient  verbalized understanding of the plan and was able to repeat key elements of the plan. Patient was given clear instructions  to go to Emergency Department or return to medical center if symptoms don't improve, worsen, or new problems develop.The patient verbalized understanding.   Orders Placed This Encounter  Procedures   Lipid panel   TSH   Hemoglobin A1c   CBC   Ambulatory referral to Gastroenterology     Return in about 1 year (around 03/01/2023) for Physical per patient preference.  Camillia Herter, NP

## 2022-02-21 NOTE — Progress Notes (Signed)
Cardiology Office Note:    Date:  02/21/2022   ID:  Cory Frazier, DOB Jul 25, 1975, MRN [id number]  PCP:  Camillia Herter, NP  Cardiologist:  Berniece Salines, DO  Electrophysiologist:  None   Referring MD: Camillia Herter, NP   " I am having palpitations"  History of Present Illness:    Cory Frazier is a 46 y.o. male with a hx of hypertension, morbid obesity, vitamin D deficiency, anxiety and depression not being treated at this time.  He is here today with his girlfriend.  He tells me that he has been experiencing for many years not increasing palpitations.  As well as chest tightness.  He notes that the palpitation comes and goes nothing makes it better or worse.  He is concerned about this because his the episodes are lasting longer.  Chest tightness hide associated with this.  He also tells me he has been worried because he was seen in emergency department and [facility] and he felt as still his EKG was not normal and he was concerned about this.  He says he Bubba Hales his grandfather go to heart disease and ever since that time he has been very anxious about heart disease.   Past Medical History:  Diagnosis Date   Anxiety    Asthma     History reviewed. No pertinent surgical history.  Current Medications: Current Meds  Medication Sig   aspirin 81 MG chewable tablet Chew 81 mg by mouth daily.   hydrochlorothiazide (HYDRODIURIL) 12.5 MG tablet Take 1 tablet (12.5 mg total) by mouth daily.   metoprolol tartrate (LOPRESSOR) 25 MG tablet Take 1 tablet (25 mg total) by mouth 2 (two) times daily.   [DISCONTINUED] losartan (COZAAR) 25 MG tablet Take 1 tablet (25 mg total) by mouth daily.     Allergies:   Codeine and Claritin [loratadine]   Social History   Socioeconomic History   Marital status: Single    Spouse name: Not on file   Number of children: Not on file   Years of education: Not on file   Highest education level: Not on file  Occupational History   Not on file  Tobacco  Use   Smoking status: Never    Passive exposure: Never   Smokeless tobacco: Never  Vaping Use   Vaping Use: Never used  Substance and Sexual Activity   Alcohol use: Yes    Comment: Occasional    Drug use: No   Sexual activity: Not Currently  Other Topics Concern   Not on file  Social History Narrative   Not on file   Social Determinants of Health   Financial Resource Strain: Not on file  Food Insecurity: Not on file  Transportation Needs: Not on file  Physical Activity: Not on file  Stress: Not on file  Social Connections: Not on file     Family History: The patient's Family history is unknown by patient.  ROS:   Review of Systems  Constitution: Negative for decreased appetite, fever and weight gain.  HENT: Negative for congestion, ear discharge, hoarse voice and sore throat.   Eyes: Negative for discharge, redness, vision loss in right eye and visual halos.  Cardiovascular: Negative for chest pain, dyspnea on exertion, leg swelling, orthopnea and palpitations.  Respiratory: Negative for cough, hemoptysis, shortness of breath and snoring.   Endocrine: Negative for heat intolerance and polyphagia.  Hematologic/Lymphatic: Negative for bleeding problem. Does not bruise/bleed easily.  Skin: Negative for flushing, nail changes, rash and suspicious lesions.  Musculoskeletal: Negative for arthritis, joint pain, muscle cramps, myalgias, neck pain and stiffness.  Gastrointestinal: Negative for abdominal pain, bowel incontinence, diarrhea and excessive appetite.  Genitourinary: Negative for decreased libido, genital sores and incomplete emptying.  Neurological: Negative for brief paralysis, focal weakness, headaches and loss of balance.  Psychiatric/Behavioral: Negative for altered mental status, depression and suicidal ideas.  Allergic/Immunologic: Negative for HIV exposure and persistent infections.    EKGs/Labs/Other Studies Reviewed:    The following studies were reviewed  today:   EKG:  The ekg ordered today demonstrates   Recent Labs: 02/08/2022: ALT 21; BUN 19; Creatinine, Ser 1.06; Potassium 4.3; Sodium 138  Recent Lipid Panel    Component Value Date/Time   CHOL 223 (H) 06/29/2020 1635   TRIG 211 (H) 06/29/2020 1635   HDL 44 06/29/2020 1635   CHOLHDL 5.1 (H) 06/29/2020 1635   LDLCALC 141 (H) 06/29/2020 1635    Physical Exam:    VS:  BP (!) 142/92   Pulse (!) 103   Ht '5\' 6"'$  (1.676 m)   Wt 229 lb (103.9 kg)   SpO2 98%   BMI 36.96 kg/m     Wt Readings from Last 3 Encounters:  02/21/22 229 lb (103.9 kg)  02/08/22 230 lb (104.3 kg)  09/08/21 234 lb (106.1 kg)     GEN: Well nourished, well developed in no acute distress HEENT: Normal NECK: No JVD; No carotid bruits LYMPHATICS: No lymphadenopathy CARDIAC: S1S2 noted,RRR, no murmurs, rubs, gallops RESPIRATORY:  Clear to auscultation without rales, wheezing or rhonchi  ABDOMEN: Soft, non-tender, non-distended, +bowel sounds, no guarding. EXTREMITIES: No edema, No cyanosis, no clubbing MUSCULOSKELETAL:  No deformity  SKIN: Warm and dry NEUROLOGIC:  Alert and oriented x 3, non-focal PSYCHIATRIC:  Normal affect, good insight  ASSESSMENT:    1. Palpitations   2. Essential (primary) hypertension   3. Chest pain, atypical   4. Obesity (BMI 30-39.9)    PLAN:     I would like to rule out a cardiovascular etiology of this palpitation, therefore at this time I would like to placed a zio patch for  14  days. In additon with his chest tightness a transthoracic echocardiogram will be ordered to assess LV/RV function and any structural abnormalities. Once these testing have been performed amd reviewed further reccomendations will be made. For now, I do reccomend that the patient goes to the nearest ED if  symptoms recur.  He is hypertensive in office today.  I am going to increase his losartan to 50 mg today.  Continue Lopressor as well as hydrochlorothiazide.  I discussed with the patient in  great length, he is got anxiety and depression which appears to be debilitating.  This is affecting his quality of life.  He needs to be treated for his anxiety.  He tells me that his PCP has referred him to psychiatry.   The patient understands the need to lose weight with diet and exercise. We have discussed specific strategies for this.  He has had previous elevated lipid profile back in 2022 we will repeat labs at his follow-up visit.  The patient is in agreement with the above plan. The patient left the office in stable condition.  The patient will follow up in 16 weeks   Medication Adjustments/Labs and Tests Ordered: Current medicines are reviewed at length with the patient today.  Concerns regarding medicines are outlined above.  Orders Placed This Encounter  Procedures   LONG TERM MONITOR (3-14 DAYS)   EKG 12-Lead  ECHOCARDIOGRAM COMPLETE   Meds ordered this encounter  Medications   losartan (COZAAR) 50 MG tablet    Sig: Take 1 tablet (50 mg total) by mouth daily.    Dispense:  90 tablet    Refill:  0    Patient Instructions  Medication Instructions:  Your physician has recommended you make the following change in your medication:  INCREASE your Losartan to '50mg'$  tablet by  mouth ONCE daily  *If you need a refill on your cardiac medications before your next appointment, please call your pharmacy*   Lab Work: none If you have labs (blood work) drawn today and your tests are completely normal, you will receive your results only by: [place] (if you have MyChart) OR A paper copy in the mail If you have any lab test that is abnormal or we need to change your treatment, we will call you to review the results.   Testing/Procedures: Your physician has requested that you have an echocardiogram. Echocardiography is a painless test that uses sound waves to create images of your heart. It provides your doctor with information about the size and shape of your heart and  how well your heart's chambers and valves are working. This procedure takes approximately one hour. There are no restrictions for this procedure. Please do NOT wear cologne, perfume, aftershave, or lotions (deodorant is allowed). Please arrive 15 minutes prior to your appointment time.  ZIO XT- Long Term Monitor Instructions  Your physician has requested you wear a ZIO patch monitor for 14 days.  This is a single patch monitor. [employer] supplies one patch monitor per enrollment. Additional stickers are not available. Please do not apply patch if you will be having a Nuclear Stress Test,  Echocardiogram, Cardiac CT, MRI, or Chest Xray during the period you would be wearing the  monitor. The patch cannot be worn during these tests. You cannot remove and re-apply the  ZIO XT patch monitor.  Your ZIO patch monitor will be mailed 3 day USPS to your address on file. It may take 3-5 days  to receive your monitor after you have been enrolled.  Once you have received your monitor, please review the enclosed instructions. Your monitor  has already been registered assigning a specific monitor serial # to you.  Billing and Patient Assistance Program Information  We have supplied [employer] with any of your insurance information on file for billing purposes. [employer] offers a sliding scale Patient Assistance Program for patients that do not have  insurance, or whose insurance does not completely cover the cost of the ZIO monitor.  You must apply for the Patient Assistance Program to qualify for this discounted rate.  To apply, please call [employer] at [phone], select option 4, select option 2, ask to apply for  Patient Assistance Program. Theodore Demark will ask your household income, and how many people  are in your household. They will quote your out-of-pocket cost based on that information.  [employer] will also be able to set up a 44-month interest-free payment plan if needed.  Applying the monitor   Shave  hair from upper left chest.  Hold abrader disc by orange tab. Rub abrader in 40 strokes over the upper left chest as  indicated in your monitor instructions.  Clean area with 4 enclosed alcohol pads. Let dry.  Apply patch as indicated in monitor instructions. Patch will be placed under collarbone on left  side of chest with arrow pointing upward.  Rub patch adhesive wings for 2  minutes. Remove white label marked "1". Remove the white  label marked "2". Rub patch adhesive wings for 2 additional minutes.  While looking in a mirror, press and release button in center of patch. A small green light will  flash 3-4 times. This will be your only indicator that the monitor has been turned on.  Do not shower for the first 24 hours. You may shower after the first 24 hours.  Press the button if you feel a symptom. You will hear a small click. Record Date, Time and  Symptom in the Patient Logbook.  When you are ready to remove the patch, follow instructions on the last 2 pages of Patient  Logbook. Stick patch monitor onto the last page of Patient Logbook.  Place Patient Logbook in the blue and white box. Use locking tab on box and tape box closed  securely. The blue and white box has prepaid postage on it. Please place it in the mailbox as  soon as possible. Your physician should have your test results approximately 7 days after the  monitor has been mailed back to [facility].  Call [place] at [phone] if you have questions regarding  your ZIO XT patch monitor. Call them immediately if you see an orange light blinking on your  monitor.  If your monitor falls off in less than 4 days, contact our Monitor department at [phone].  If your monitor becomes loose or falls off after 4 days call [employer] at [phone] for  suggestions on securing your monitor    Follow-Up: At [facility], you and your health needs are our priority.  As part of our continuing  mission to provide you with exceptional heart care, we have created designated Provider Care Teams.  These Care Teams include your primary Cardiologist (physician) and Advanced Practice Providers (APPs -  Physician Assistants and Nurse Practitioners) who all work together to provide you with the care you need, when you need it.  We recommend signing up for the patient portal called "MyChart".  Sign up information is provided on this After Visit Summary.  MyChart is used to connect with patients for Virtual Visits (Telemedicine).  Patients are able to view lab/test results, encounter notes, upcoming appointments, etc.  Non-urgent messages can be sent to your provider as well.   To learn more about what you can do with MyChart, go to [email or web address].    Your next appointment:   16 week(s)  The format for your next appointment:   In Person  Provider:   Berniece Salines, DO     Other Instructions none  Important Information About Sugar         Adopting a Healthy Lifestyle.  Know what a healthy weight is for you (roughly BMI <25) and aim to maintain this   Aim for 7+ servings of fruits and vegetables daily   65-80+ fluid ounces of water or unsweet tea for healthy kidneys   Limit to max 1 drink of alcohol per day; avoid smoking/tobacco   Limit animal fats in diet for cholesterol and heart health - choose grass fed whenever available   Avoid highly processed foods, and foods high in saturated/trans fats   Aim for low stress - take time to unwind and care for your mental health   Aim for 150 min of moderate intensity exercise weekly for heart health, and weights twice weekly for bone health   Aim for 7-9 hours of sleep daily   When it comes  to diets, agreement about the perfect plan isnt easy to find, even among the experts. Experts at the [employer] developed an idea known as the Healthy Eating Plate. Just imagine a plate divided into logical, healthy  portions.   The emphasis is on diet quality:   Load up on vegetables and fruits - one-half of your plate: Aim for color and variety, and remember that potatoes dont count.   Go for whole grains - one-quarter of your plate: Whole wheat, barley, wheat berries, quinoa, oats, brown rice, and foods made with them. If you want pasta, go with whole wheat pasta.   Protein power - one-quarter of your plate: Fish, chicken, beans, and nuts are all healthy, versatile protein sources. Limit red meat.   The diet, however, does go beyond the plate, offering a few other suggestions.   Use healthy plant oils, such as olive, canola, soy, corn, sunflower and peanut. Check the labels, and avoid partially hydrogenated oil, which have unhealthy trans fats.   If youre thirsty, drink water. Coffee and tea are good in moderation, but skip sugary drinks and limit milk and dairy products to one or two daily servings.   The type of carbohydrate in the diet is more important than the amount. Some sources of carbohydrates, such as vegetables, fruits, whole grains, and beans-are healthier than others.   Finally, stay active  Signed, Berniece Salines, DO  02/21/2022 10:54 AM    [facility]

## 2022-02-21 NOTE — Progress Notes (Unsigned)
Enrolled for [employer] to mail a ZIO XT long term holter monitor to the patients address on file.  

## 2022-02-21 NOTE — Patient Instructions (Signed)
Medication Instructions:  Your physician has recommended you make the following change in your medication:  INCREASE your Losartan to '50mg'$  tablet by  mouth ONCE daily  *If you need a refill on your cardiac medications before your next appointment, please call your pharmacy*   Lab Work: none If you have labs (blood work) drawn today and your tests are completely normal, you will receive your results only by: [place] (if you have MyChart) OR A paper copy in the mail If you have any lab test that is abnormal or we need to change your treatment, we will call you to review the results.   Testing/Procedures: Your physician has requested that you have an echocardiogram. Echocardiography is a painless test that uses sound waves to create images of your heart. It provides your doctor with information about the size and shape of your heart and how well your heart's chambers and valves are working. This procedure takes approximately one hour. There are no restrictions for this procedure. Please do NOT wear cologne, perfume, aftershave, or lotions (deodorant is allowed). Please arrive 15 minutes prior to your appointment time.  ZIO XT- Long Term Monitor Instructions  Your physician has requested you wear a ZIO patch monitor for 14 days.  This is a single patch monitor. [employer] supplies one patch monitor per enrollment. Additional stickers are not available. Please do not apply patch if you will be having a Nuclear Stress Test,  Echocardiogram, Cardiac CT, MRI, or Chest Xray during the period you would be wearing the  monitor. The patch cannot be worn during these tests. You cannot remove and re-apply the  ZIO XT patch monitor.  Your ZIO patch monitor will be mailed 3 day USPS to your address on file. It may take 3-5 days  to receive your monitor after you have been enrolled.  Once you have received your monitor, please review the enclosed instructions. Your monitor  has already been  registered assigning a specific monitor serial # to you.  Billing and Patient Assistance Program Information  We have supplied [employer] with any of your insurance information on file for billing purposes. [employer] offers a sliding scale Patient Assistance Program for patients that do not have  insurance, or whose insurance does not completely cover the cost of the ZIO monitor.  You must apply for the Patient Assistance Program to qualify for this discounted rate.  To apply, please call [employer] at [phone], select option 4, select option 2, ask to apply for  Patient Assistance Program. Theodore Demark will ask your household income, and how many people  are in your household. They will quote your out-of-pocket cost based on that information.  [employer] will also be able to set up a 68-month interest-free payment plan if needed.  Applying the monitor   Shave hair from upper left chest.  Hold abrader disc by orange tab. Rub abrader in 40 strokes over the upper left chest as  indicated in your monitor instructions.  Clean area with 4 enclosed alcohol pads. Let dry.  Apply patch as indicated in monitor instructions. Patch will be placed under collarbone on left  side of chest with arrow pointing upward.  Rub patch adhesive wings for 2 minutes. Remove white label marked "1". Remove the white  label marked "2". Rub patch adhesive wings for 2 additional minutes.  While looking in a mirror, press and release button in center of patch. A small green light will  flash 3-4 times. This will be your only indicator  that the monitor has been turned on.  Do not shower for the first 24 hours. You may shower after the first 24 hours.  Press the button if you feel a symptom. You will hear a small click. Record Date, Time and  Symptom in the Patient Logbook.  When you are ready to remove the patch, follow instructions on the last 2 pages of Patient  Logbook. Stick patch monitor onto the last page of Patient  Logbook.  Place Patient Logbook in the blue and white box. Use locking tab on box and tape box closed  securely. The blue and white box has prepaid postage on it. Please place it in the mailbox as  soon as possible. Your physician should have your test results approximately 7 days after the  monitor has been mailed back to [facility].  Call [place] at [phone] if you have questions regarding  your ZIO XT patch monitor. Call them immediately if you see an orange light blinking on your  monitor.  If your monitor falls off in less than 4 days, contact our Monitor department at [phone].  If your monitor becomes loose or falls off after 4 days call [employer] at [phone] for  suggestions on securing your monitor    Follow-Up: At [facility], you and your health needs are our priority.  As part of our continuing mission to provide you with exceptional heart care, we have created designated Provider Care Teams.  These Care Teams include your primary Cardiologist (physician) and Advanced Practice Providers (APPs -  Physician Assistants and Nurse Practitioners) who all work together to provide you with the care you need, when you need it.  We recommend signing up for the patient portal called "MyChart".  Sign up information is provided on this After Visit Summary.  MyChart is used to connect with patients for Virtual Visits (Telemedicine).  Patients are able to view lab/test results, encounter notes, upcoming appointments, etc.  Non-urgent messages can be sent to your provider as well.   To learn more about what you can do with MyChart, go to [email or web address].    Your next appointment:   16 week(s)  The format for your next appointment:   In Person  Provider:   Berniece Salines, DO     Other Instructions none  Important Information About Sugar

## 2022-02-28 ENCOUNTER — Encounter: Payer: Self-pay | Admitting: Family

## 2022-02-28 ENCOUNTER — Ambulatory Visit (INDEPENDENT_AMBULATORY_CARE_PROVIDER_SITE_OTHER): Payer: Medicaid Other | Admitting: Family

## 2022-02-28 VITALS — BP 128/91 | HR 121 | Temp 98.3°F | Resp 16 | Ht 65.98 in | Wt 228.0 lb

## 2022-02-28 DIAGNOSIS — Z1329 Encounter for screening for other suspected endocrine disorder: Secondary | ICD-10-CM

## 2022-02-28 DIAGNOSIS — Z130 Encounter for screening for diseases of the blood and blood-forming organs and certain disorders involving the immune mechanism: Secondary | ICD-10-CM | POA: Diagnosis not present

## 2022-02-28 DIAGNOSIS — Z0000 Encounter for general adult medical examination without abnormal findings: Secondary | ICD-10-CM

## 2022-02-28 DIAGNOSIS — Z0001 Encounter for general adult medical examination with abnormal findings: Secondary | ICD-10-CM | POA: Diagnosis not present

## 2022-02-28 DIAGNOSIS — Z131 Encounter for screening for diabetes mellitus: Secondary | ICD-10-CM

## 2022-02-28 DIAGNOSIS — Z13220 Encounter for screening for lipoid disorders: Secondary | ICD-10-CM

## 2022-02-28 DIAGNOSIS — R140 Abdominal distension (gaseous): Secondary | ICD-10-CM

## 2022-02-28 NOTE — Patient Instructions (Addendum)
Please call [facility] Address: [address], [place] [address] Phone: [phone]   Preventive Care 47-47 Years Old, Male Preventive care refers to lifestyle choices and visits with your health care provider that can promote health and wellness. Preventive care visits are also called wellness exams. What can I expect for my preventive care visit? Counseling During your preventive care visit, your health care provider may ask about your: Medical history, including: Past medical problems. Family medical history. Current health, including: Emotional well-being. Home life and relationship well-being. Sexual activity. Lifestyle, including: Alcohol, nicotine or tobacco, and drug use. Access to firearms. Diet, exercise, and sleep habits. Safety issues such as seatbelt and bike helmet use. Sunscreen use. Work and work Statistician. Physical exam Your health care provider will check your: Height and weight. These may be used to calculate your BMI (body mass index). BMI is a measurement that tells if you are at a healthy weight. Waist circumference. This measures the distance around your waistline. This measurement also tells if you are at a healthy weight and may help predict your risk of certain diseases, such as type 2 diabetes and high blood pressure. Heart rate and blood pressure. Body temperature. Skin for abnormal spots. What immunizations do I need?  Vaccines are usually given at various ages, according to a schedule. Your health care provider will recommend vaccines for you based on your age, medical history, and lifestyle or other factors, such as travel or where you work. What tests do I need? Screening Your health care provider may recommend screening tests for certain conditions. This may include: Lipid and cholesterol levels. Diabetes screening. This is done by checking your blood sugar (glucose) after you have not eaten for a while  (fasting). Hepatitis B test. Hepatitis C test. HIV (human immunodeficiency virus) test. STI (sexually transmitted infection) testing, if you are at risk. Lung cancer screening. Prostate cancer screening. Colorectal cancer screening. Talk with your health care provider about your test results, treatment options, and if necessary, the need for more tests. Follow these instructions at home: Eating and drinking  Eat a diet that includes fresh fruits and vegetables, whole grains, lean protein, and low-fat dairy products. Take vitamin and mineral supplements as recommended by your health care provider. Do not drink alcohol if your health care provider tells you not to drink. If you drink alcohol: Limit how much you have to 0-2 drinks a day. Know how much alcohol is in your drink. In the [place] one drink equals one 12 oz bottle of beer (355 mL), one 5 oz glass of wine (148 mL), or one 1 oz glass of hard liquor (44 mL). Lifestyle Brush your teeth every morning and night with fluoride toothpaste. Floss one time each day. Exercise for at least 30 minutes 5 or more days each week. Do not use any products that contain nicotine or tobacco. These products include cigarettes, chewing tobacco, and vaping devices, such as e-cigarettes. If you need help quitting, ask your health care provider. Do not use drugs. If you are sexually active, practice safe sex. Use a condom or other form of protection to prevent STIs. Take aspirin only as told by your health care provider. Make sure that you understand how much to take and what form to take. Work with your health care provider to find out whether it is safe and beneficial for you to take aspirin daily. Find healthy ways to manage stress, such as: Meditation, yoga, or listening to  music. Journaling. Talking to a trusted person. Spending time with friends and family. Minimize exposure to UV radiation to reduce your risk of skin cancer. Safety Always wear  your seat belt while driving or riding in a vehicle. Do not drive: If you have been drinking alcohol. Do not ride with someone who has been drinking. When you are tired or distracted. While texting. If you have been using any mind-altering substances or drugs. Wear a helmet and other protective equipment during sports activities. If you have firearms in your house, make sure you follow all gun safety procedures. What's next? Go to your health care provider once a year for an annual wellness visit. Ask your health care provider how often you should have your eyes and teeth checked. Stay up to date on all vaccines. This information is not intended to replace advice given to you by your health care provider. Make sure you discuss any questions you have with your health care provider. Document Revised: 09/02/2020 Document Reviewed: 09/02/2020 Elsevier Patient Education  [place].

## 2022-02-28 NOTE — Progress Notes (Signed)
.  Pt presents for annual physical exam  

## 2022-03-01 ENCOUNTER — Telehealth: Payer: Self-pay | Admitting: Cardiology

## 2022-03-01 ENCOUNTER — Other Ambulatory Visit: Payer: Self-pay | Admitting: Family

## 2022-03-01 ENCOUNTER — Other Ambulatory Visit: Payer: Self-pay

## 2022-03-01 DIAGNOSIS — D751 Secondary polycythemia: Secondary | ICD-10-CM

## 2022-03-01 DIAGNOSIS — E785 Hyperlipidemia, unspecified: Secondary | ICD-10-CM | POA: Insufficient documentation

## 2022-03-01 LAB — LIPID PANEL
Chol/HDL Ratio: 5.3 ratio — ABNORMAL HIGH (ref 0.0–5.0)
Cholesterol, Total: 228 mg/dL — ABNORMAL HIGH (ref 100–199)
HDL: 43 mg/dL (ref >39)
LDL Chol Calc (NIH): 147 mg/dL — ABNORMAL HIGH (ref 0–99)
Triglycerides: 211 mg/dL — ABNORMAL HIGH (ref 0–149)
VLDL Cholesterol Cal: 38 mg/dL (ref 5–40)

## 2022-03-01 LAB — CBC
Hematocrit: 57.3 % — ABNORMAL HIGH (ref 37.5–51.0)
Hemoglobin: 18.9 g/dL — ABNORMAL HIGH (ref 13.0–17.7)
MCH: 29.4 pg (ref 26.6–33.0)
MCHC: 33 g/dL (ref 31.5–35.7)
MCV: 89 fL (ref 79–97)
Platelets: 263 10*3/uL (ref 150–450)
RBC: 6.43 x10E6/uL — ABNORMAL HIGH (ref 4.14–5.80)
RDW: 12.7 % (ref 11.6–15.4)
WBC: 8.6 10*3/uL (ref 3.4–10.8)

## 2022-03-01 LAB — HEMOGLOBIN A1C
Est. average glucose Bld gHb Est-mCnc: 105 mg/dL
Hgb A1c MFr Bld: 5.3 % (ref 4.8–5.6)

## 2022-03-01 LAB — TSH: TSH: 1.47 u[IU]/mL (ref 0.450–4.500)

## 2022-03-01 MED ORDER — ATORVASTATIN CALCIUM 20 MG PO TABS: 20.0000 mg | ORAL_TABLET | Freq: Every day | ORAL | 2 refills | Status: DC

## 2022-03-01 NOTE — Telephone Encounter (Signed)
Called patient, he states that he would like to know what he should be done in regards to the medication. I advised I did not see any response yet, but I would check with our pharmacy team.   He states he checked BP around 30 minutes ago and it was 147/97. He does have some anxiety, however he states he only took Losartan 25 mg dose last night around 8. He is questioning if he could take another 25 mg dose of the Losartan now, I was unsure since he states he has a rash come up with the Losartan.   Will route to [facility].   Thanks!

## 2022-03-01 NOTE — Telephone Encounter (Signed)
Patient is returning phone call. Requested that a call be returned today

## 2022-03-01 NOTE — Telephone Encounter (Signed)
Called patient back, advised of response from [facility].   Patient would like to try increasing the HCTZ first, monitor BP and then increase Metoprolol after a few days.   I did schedule for a follow up with PHARMD in a few weeks, patient verbalized understanding, thankful for call back.

## 2022-03-01 NOTE — Telephone Encounter (Signed)
Would keep losartan at '25mg'$  daily and increase HCTZ to '25mg'$  daily. Increase metoprolol to '50mg'$  twice a day. Please schedule in pharmD clinic for follow up.

## 2022-03-01 NOTE — Telephone Encounter (Signed)
Returned call to patient-patient reports last week at OV his losartan was increased to 50 mg daily.  The next day he noticed his face feeling flushed but this resolved.  He also noticed itching on his face although no rash.   He has also developed a rash at the bend of his arm (bend of elbow).   Rash is red, not raised, but is itchy.    He went to his PCP yesterday and was told to contact cardiology to discuss.    He also reports concerns with continued increased BP readings.  Reports this is mainly the bottom number (diastolic).   Reports diastolic readings in the 67M-094B.    Reports HR was also elevated at his PCP-121.     He decreased losartan back down to 25 mg daily-he has been on this dose for about a year prior without issues.    He also takes hctz 12.5 and metoprolol 25 BID.    Advised would send message to MD and pharmD to review

## 2022-03-01 NOTE — Telephone Encounter (Signed)
Pt c/o medication issue:  1. Name of Medication: losartan (COZAAR) 50 MG tablet   2. How are you currently taking this medication (dosage and times per day)? Cut back half last night  3. Are you having a reaction (difficulty breathing--STAT)? Yes  4. What is your medication issue? Rash on arms.  Pt states there was an increase in dosage recently and it seems to be causing a rash. Requesting call back to discuss.

## 2022-03-02 ENCOUNTER — Telehealth: Payer: Self-pay | Admitting: Physician Assistant

## 2022-03-02 ENCOUNTER — Telehealth: Payer: Self-pay | Admitting: *Deleted

## 2022-03-02 DIAGNOSIS — R002 Palpitations: Secondary | ICD-10-CM | POA: Diagnosis not present

## 2022-03-02 NOTE — Telephone Encounter (Signed)
Left message for pt to call back  °

## 2022-03-02 NOTE — Telephone Encounter (Signed)
Scheduled appointment per referral. Patient is aware of appointment date and time. Patient is aware to arrive 15 mins prior to appointment time and to bring updated insurance cards. Patient is aware of location.   

## 2022-03-02 NOTE — Telephone Encounter (Signed)
Received call from patient. He was having some issues with his blood pressure still and states he would like to know if he can take another 25 mg Losartan (see notes below) patient was having issues with the 50 mg which is why I stated pharmacy team recommended he stay on the 25 mg. Patient would like Dr.Tobb to look over his blood work from his PCP (his blood levels are higher) and they believe this could be the cause of his higher readings. Patient is very anxious, which we did discuss not taking the blood pressure multiple times, as the anxiety could cause issues with the higher readings as well. Patient states he has taken a xanax form 2018 and does not see much difference. I did advise he could contact his PCP for a new RX of this medication as it may help.   They are sending him to oncology due to his blood levels, but he would like some advice on what to do about his readings being higher. I did advise of ED precautions, and on call service as well.   Will route to pharmacy team and MD.

## 2022-03-02 NOTE — Telephone Encounter (Signed)
Cory Frazier, Kardie, DO  Caprice Beaver, LPN; Cv Div Pharmd    He needs to be seen. Can we get him in with one of the APPs.   Spoke with pt regarding Dr. Terrial Rhodes recommendations. Pt scheduled to see Almyra Deforest, PA tomorrow. Pt inquires about taking additional dose of losartan tonight. Advised pt that we don't recommend doing that at this time, explained that Isaac Laud can make medication changes tomorrow if needed. Pt verbalizes understanding.

## 2022-03-02 NOTE — Telephone Encounter (Signed)
Pt calling to review lab results he saw on MyChart, reviewed to pt's satisfaction.  Made aware hematology referral was placed. Requesting referral/appt be expedited if possible. States highly anxious regarding results. Assured pt NT would route to practice for PCPs review. Please advise.

## 2022-03-02 NOTE — Telephone Encounter (Signed)
Patient is calling back with update and more questions. Please advise

## 2022-03-03 ENCOUNTER — Encounter: Payer: Self-pay | Admitting: Physician Assistant

## 2022-03-03 ENCOUNTER — Ambulatory Visit: Payer: Medicaid Other | Attending: Physician Assistant | Admitting: Physician Assistant

## 2022-03-03 VITALS — BP 122/98 | HR 96 | Ht 66.0 in | Wt 225.0 lb

## 2022-03-03 DIAGNOSIS — I10 Essential (primary) hypertension: Secondary | ICD-10-CM | POA: Diagnosis not present

## 2022-03-03 DIAGNOSIS — R002 Palpitations: Secondary | ICD-10-CM

## 2022-03-03 MED ORDER — HYDROCHLOROTHIAZIDE 12.5 MG PO TABS: 25.0000 mg | ORAL_TABLET | Freq: Every day | ORAL | 0 refills | Status: DC

## 2022-03-03 MED ORDER — LOSARTAN POTASSIUM 25 MG PO TABS: 25.0000 mg | ORAL_TABLET | Freq: Every day | ORAL | Status: DC

## 2022-03-03 NOTE — Patient Instructions (Signed)
Medication Instructions:  TAKE '25MG'$  OF YOUR HYROCHLOROTHIAZIDE  DECREASE LOSARTAN '25MG'$  DAILY  MAY TAKE EXTRA METOPROLOL FOR BLOOD PRESSURE >150  *If you need a refill on your cardiac medications before your next appointment, please call your pharmacy*  Lab Work: NONE If you have labs (blood work) drawn today and your tests are completely normal, you will receive your results only by:  [place] (if you have MyChart) OR A paper copy in the mail If you have any lab test that is abnormal or we need to change your treatment, we will call you to review the results.  Testing/Procedures: NONE  Follow-Up: At [facility], you and your health needs are our priority.  As part of our continuing mission to provide you with exceptional heart care, we have created designated Provider Care Teams.  These Care Teams include your primary Cardiologist (physician) and Advanced Practice Providers (APPs -  Physician Assistants and Nurse Practitioners) who all work together to provide you with the care you need, when you need it.  Your next appointment:   KEEP SCHEDULED FOLLOW UP   The format for your next appointment:   In Person  Provider:   Berniece Salines, DO     Other Instructions   Important Information About Sugar

## 2022-03-03 NOTE — Progress Notes (Signed)
Cardiology Office Note:    Date:  03/05/2022   ID:  Cory Frazier, DOB 08-23-1975, MRN [id number]  PCP:  Camillia Herter, NP   [facility] [place] Providers Cardiologist:  Berniece Salines, DO     Referring MD: Camillia Herter, NP   Chief Complaint  Patient presents with   Follow-up    History of Present Illness:    Cory Frazier is a 46 y.o. male with a hx of morbid obesity, hypertension, vitamin D deficiency, and anxiety/depression.  Patient was last seen by Dr. Harriet Masson last week on 02/21/2022.  He was complaining both palpitation and chest discomfort.  Dr. Harriet Masson recommended an echocardiogram and a ZIO monitor.  Losartan was increased to 50 mg daily due to elevated blood pressure.  He was also on Lopressor and hydrochlorothiazide.  Patient presents today accompanied by wife.  Today after he increased the losartan, he developed a rash on his face.  He has since decreased the losartan back down to 25 mg daily.  Instead, at the recommendation of our clinical pharmacist, he has increased hydrochlorothiazide to 25 mg daily.  I reviewed his blood pressure diary for the past few days, he has blood pressure is quite well-controlled at home however with occasional spikes up to 150-170s range.  I suspect the blood pressure spike has to do with anxiety level and I did not recommend further adjustment of his blood pressure medication, although I did tell the patient he is okay to take extra dose of short acting metoprolol as needed if systolic blood pressures greater than 150. He continued to have occasional palpitation, I emphasized on the importance of doing manual triggered events on his heart monitor while he does feel with the palpitation.  Once the monitor is completed, we can review further.  He can keep his previously arranged follow-up with Dr. Harriet Masson.   Past Medical History:  Diagnosis Date   Anxiety    Asthma     History reviewed. No pertinent surgical history.  Current Medications: Current  Meds  Medication Sig   atorvastatin (LIPITOR) 20 MG tablet Take 1 tablet (20 mg total) by mouth daily.   metoprolol tartrate (LOPRESSOR) 25 MG tablet Take 1 tablet (25 mg total) by mouth 2 (two) times daily.   [DISCONTINUED] hydrochlorothiazide (HYDRODIURIL) 12.5 MG tablet Take 1 tablet (12.5 mg total) by mouth daily.   [DISCONTINUED] losartan (COZAAR) 50 MG tablet Take 1 tablet (50 mg total) by mouth daily. (Patient taking differently: Take 25 mg by mouth daily.)     Allergies:   Codeine and Claritin [loratadine]   Social History   Socioeconomic History   Marital status: Single    Spouse name: Not on file   Number of children: Not on file   Years of education: Not on file   Highest education level: Not on file  Occupational History   Not on file  Tobacco Use   Smoking status: Never    Passive exposure: Never   Smokeless tobacco: Never  Vaping Use   Vaping Use: Never used  Substance and Sexual Activity   Alcohol use: Yes    Comment: Occasional    Drug use: No   Sexual activity: Not Currently  Other Topics Concern   Not on file  Social History Narrative   Not on file   Social Determinants of Health   Financial Resource Strain: Not on file  Food Insecurity: Not on file  Transportation Needs: Not on file  Physical Activity: Not on  file  Stress: Not on file  Social Connections: Not on file     Family History: The patient's Family history is unknown by patient.  ROS:   Please see the history of present illness.     All other systems reviewed and are negative.  EKGs/Labs/Other Studies Reviewed:    The following studies were reviewed today:  N/A  EKG:  EKG is not ordered today.   Recent Labs: 02/08/2022: ALT 21; BUN 19; Creatinine, Ser 1.06; Potassium 4.3; Sodium 138 02/28/2022: Hemoglobin 18.9; Platelets 263; TSH 1.470  Recent Lipid Panel    Component Value Date/Time   CHOL 228 (H) 02/28/2022 1043   TRIG 211 (H) 02/28/2022 1043   HDL 43 02/28/2022 1043    CHOLHDL 5.3 (H) 02/28/2022 1043   LDLCALC 147 (H) 02/28/2022 1043     Risk Assessment/Calculations:           Physical Exam:    VS:  BP (!) 122/98 (BP Location: Right Arm, Patient Position: Sitting, Cuff Size: Normal)   Pulse 96   Ht '5\' 6"'$  (1.676 m)   Wt 225 lb (102.1 kg)   BMI 36.32 kg/m        Wt Readings from Last 3 Encounters:  03/03/22 225 lb (102.1 kg)  02/28/22 228 lb (103.4 kg)  02/21/22 229 lb (103.9 kg)     GEN:  Well nourished, well developed in no acute distress HEENT: Normal NECK: No JVD; No carotid bruits LYMPHATICS: No lymphadenopathy CARDIAC: RRR, no murmurs, rubs, gallops RESPIRATORY:  Clear to auscultation without rales, wheezing or rhonchi  ABDOMEN: Soft, non-tender, non-distended MUSCULOSKELETAL:  No edema; No deformity  SKIN: Warm and dry NEUROLOGIC:  Alert and oriented x 3 PSYCHIATRIC:  Normal affect   ASSESSMENT:    1. Palpitations   2. Essential (primary) hypertension    PLAN:    In order of problems listed above:  Palpitation: Pending ZIO monitor and echocardiogram.  Hypertension: Although during the previous office visit, he was instructed to increase losartan to 50 mg daily, however he developed a rash after starting on the higher dose of losartan.  He has since reduced the losartan dosage back down to 25 mg daily.  At the recommendation of our clinical pharmacist, he increased hydrochlorothiazide to 25 mg daily.  This appears to have controlled his blood pressure quite well.  He still has occasional spikes in the blood pressure up to 150 or higher.  I am okay with the patient take extra dose of metoprolol on a as needed basis for systolic blood pressure greater than 150.  I suspect the blood pressure spike is more related to anxiety.  He has a upcoming appointment with psychiatry.           Medication Adjustments/Labs and Tests Ordered: Current medicines are reviewed at length with the patient today.  Concerns regarding  medicines are outlined above.  No orders of the defined types were placed in this encounter.  Meds ordered this encounter  Medications   hydrochlorothiazide (HYDRODIURIL) 12.5 MG tablet    Sig: Take 2 tablets (25 mg total) by mouth daily.    Dispense:  90 tablet    Refill:  0   losartan (COZAAR) 25 MG tablet    Sig: Take 1 tablet (25 mg total) by mouth daily.    Patient Instructions  Medication Instructions:  TAKE '25MG'$  OF YOUR HYROCHLOROTHIAZIDE  DECREASE LOSARTAN '25MG'$  DAILY  MAY TAKE EXTRA METOPROLOL FOR BLOOD PRESSURE >150  *If you need a refill  on your cardiac medications before your next appointment, please call your pharmacy*  Lab Work: NONE If you have labs (blood work) drawn today and your tests are completely normal, you will receive your results only by:  [place] (if you have MyChart) OR A paper copy in the mail If you have any lab test that is abnormal or we need to change your treatment, we will call you to review the results.  Testing/Procedures: NONE  Follow-Up: At [facility], you and your health needs are our priority.  As part of our continuing mission to provide you with exceptional heart care, we have created designated Provider Care Teams.  These Care Teams include your primary Cardiologist (physician) and Advanced Practice Providers (APPs -  Physician Assistants and Nurse Practitioners) who all work together to provide you with the care you need, when you need it.  Your next appointment:   KEEP SCHEDULED FOLLOW UP   The format for your next appointment:   In Person  Provider:   Berniece Salines, DO     Other Instructions   Important Information About Sugar         Hilbert Corrigan, [place]  03/05/2022 10:50 PM    [place]

## 2022-03-07 DIAGNOSIS — F411 Generalized anxiety disorder: Secondary | ICD-10-CM | POA: Diagnosis not present

## 2022-03-11 ENCOUNTER — Telehealth: Payer: Self-pay | Admitting: Cardiology

## 2022-03-11 ENCOUNTER — Telehealth: Payer: Self-pay

## 2022-03-11 ENCOUNTER — Telehealth: Payer: Self-pay | Admitting: Family

## 2022-03-11 ENCOUNTER — Ambulatory Visit: Payer: Self-pay | Admitting: *Deleted

## 2022-03-11 MED ORDER — LOSARTAN POTASSIUM 50 MG PO TABS: 50.0000 mg | ORAL_TABLET | Freq: Every day | ORAL | 3 refills | Status: DC

## 2022-03-11 NOTE — Telephone Encounter (Signed)
Patient informed to take HCTZ '25mg'$  daily and losartan '50mg'$  daily. Patient stated he has been taking HCTZ 25 mg daily since last appointment. He stated he still has rash on bend of elbow that he contributed to the losartan. Explained that a medication rash would not be just in his bend arm. He agreed to take losartan '50mg'$  daily.

## 2022-03-11 NOTE — Telephone Encounter (Signed)
Patient is returning phone call about medication and BP. Please call back

## 2022-03-11 NOTE — Telephone Encounter (Signed)
Summary: Seeking Rx for panic attacks   Pt called reporting that he had a panic attack during his appt with Amy Minette Brine weeks ago, he cannot see the psychiatrist until the beginning of January. His cardiologist says his panic attacks are elevating his blood pressure. He is calling to see if Amy Minette Brine can prescribe him anything until his appt in January.  Best contact: [phone]       Reason for Disposition  [1] Caller has URGENT medicine question about med that PCP or specialist prescribed AND [2] triager unable to answer question  Answer Assessment - Initial Assessment Questions 1. NAME of MEDICINE: "What medicine(s) are you calling about?"     Patient has panic attacks that are causing BP elevations - he has contacted cardiologist and they requested a short term Rx until seen  2. QUESTION: "What is your question?" (e.g., double dose of medicine, side effect)     Patient did try xanax in the past and it helped- he did stop medication. Patient had to have help with shower yesterday- felt paralyzed- he did use old Rx of xanax that did help. 3. PRESCRIBER: "Who prescribed the medicine?" Reason: if prescribed by specialist, call should be referred to that group.     PCP 4. SYMPTOMS: "Do you have any symptoms?" If Yes, ask: "What symptoms are you having?"  "How bad are the symptoms (e.g., mild, moderate, severe)     Patient is stressed about waiting for appointment - patient has ben given ativan at ED that made him sick  Patient does not want medications if not necessary- but he feels he needs something for nerves. Patient feels he is unable to tell if he has attack coming.  Protocols used: Medication Question Call-A-AH

## 2022-03-11 NOTE — Telephone Encounter (Signed)
Pt c/o BP issue: STAT if pt c/o blurred vision, one-sided weakness or slurred speech  1. What are your last 5 BP readings?  171/106 - Last night 152/108 - Last night 103/68 - This morning  2. Are you having any other symptoms (ex. Dizziness, headache, blurred vision, passed out)? Dizziness  3. What is your BP issue? Pt states BP has been up and down, which has him concerned. He states that adjustments has been made to his medications and thinks this may has something to do with BP issues.

## 2022-03-11 NOTE — Telephone Encounter (Signed)
See prior notes

## 2022-03-11 NOTE — Telephone Encounter (Signed)
  Chief Complaint: medication request Symptoms: increased panic attacks Frequency: yesterday- last attack Pertinent Negatives: Patient denies   Disposition: '[]'$ ED /'[]'$ Urgent Care (no appt availability in office) / '[]'$ Appointment(In office/virtual)/ '[]'$  [facility]/ '[]'$ Home Care/ '[]'$ Refused Recommended Disposition /'[]'$ [facility]/ '[x]'$  Follow-up with PCP Additional Notes: Patient states he has appointment with psychiatry on 03/29/22- his cardiologist has recommended he call PCP to see if he can get something to help with his anxiety and panic attack until he is seen on 03/29/22 . Patient states PCP is aware of his attacks

## 2022-03-11 NOTE — Telephone Encounter (Addendum)
Spoke with patient and informed him not to take metoprolol. He asked if that just meant the prn dose or the '25mg'$  BID as well. Please advise. This patient is very nervous about his medications and BP. Gave him the on-call number for the holiday weekend.

## 2022-03-11 NOTE — Telephone Encounter (Signed)
Patient called to triage to ask if the prn dose of metoprolol tartrate '25mg'$  can be taken if SBP over 150 now that he is increasing losartan. Please advise.

## 2022-03-11 NOTE — Telephone Encounter (Signed)
Patient stated that last night, his BPs were 171/106 and 152/108. This AM 103/68. He stated he has a lot of anxiety regarding BP. He checks it frequently. Recommended that he check BP 2 hours after BP medication or when he is "feeling bad." He stated he takes a family member's old xanax 0.'25mg'$ . Because he gets anxious when he focuses on his body. He stated his PCP wants him to make appointment with psychiatrist. His is afraid of being alone this weekend if his BP goes up. His roommate will be gone. Recommended that he talk walks or do activities out of the house so he is not focused on his body. His concern is the elevated BP readings.

## 2022-03-15 ENCOUNTER — Ambulatory Visit (HOSPITAL_COMMUNITY): Payer: Medicaid Other | Attending: Cardiology

## 2022-03-15 DIAGNOSIS — R0789 Other chest pain: Secondary | ICD-10-CM

## 2022-03-15 LAB — ECHOCARDIOGRAM COMPLETE
Area-P 1/2: 3.85 cm2
S' Lateral: 2.9 cm

## 2022-03-15 NOTE — Telephone Encounter (Signed)
Spoke with patient. This AM BP 135/98, P 70. Made appointment for 12/28 with Dr. Harriet Masson. OK to double book.

## 2022-03-16 ENCOUNTER — Encounter: Payer: Self-pay | Admitting: Physician Assistant

## 2022-03-16 ENCOUNTER — Other Ambulatory Visit: Payer: Self-pay

## 2022-03-16 ENCOUNTER — Inpatient Hospital Stay: Payer: Medicaid Other | Attending: Physician Assistant | Admitting: Physician Assistant

## 2022-03-16 ENCOUNTER — Telehealth: Payer: Self-pay | Admitting: Physician Assistant

## 2022-03-16 ENCOUNTER — Inpatient Hospital Stay: Payer: Medicaid Other

## 2022-03-16 VITALS — BP 136/85 | HR 85 | Temp 97.8°F | Resp 18 | Ht 66.0 in | Wt 221.3 lb

## 2022-03-16 DIAGNOSIS — D751 Secondary polycythemia: Secondary | ICD-10-CM | POA: Diagnosis not present

## 2022-03-16 DIAGNOSIS — R42 Dizziness and giddiness: Secondary | ICD-10-CM | POA: Insufficient documentation

## 2022-03-16 DIAGNOSIS — R0683 Snoring: Secondary | ICD-10-CM

## 2022-03-16 DIAGNOSIS — H539 Unspecified visual disturbance: Secondary | ICD-10-CM | POA: Insufficient documentation

## 2022-03-16 LAB — CMP (CANCER CENTER ONLY)
ALT: 36 U/L (ref 0–44)
AST: 25 U/L (ref 15–41)
Albumin: 4.5 g/dL (ref 3.5–5.0)
Alkaline Phosphatase: 52 U/L (ref 38–126)
Anion gap: 5 (ref 5–15)
BUN: 13 mg/dL (ref 6–20)
CO2: 30 mmol/L (ref 22–32)
Calcium: 9.9 mg/dL (ref 8.9–10.3)
Chloride: 99 mmol/L (ref 98–111)
Creatinine: 1.12 mg/dL (ref 0.61–1.24)
GFR, Estimated: >60 mL/min (ref >60)
Glucose, Bld: 111 mg/dL — ABNORMAL HIGH (ref 70–99)
Potassium: 3.5 mmol/L (ref 3.5–5.1)
Sodium: 134 mmol/L — ABNORMAL LOW (ref 135–145)
Total Bilirubin: 0.7 mg/dL (ref 0.3–1.2)
Total Protein: 7.1 g/dL (ref 6.5–8.1)

## 2022-03-16 LAB — CBC WITH DIFFERENTIAL (CANCER CENTER ONLY)
Abs Immature Granulocytes: 0.01 10*3/uL (ref 0.00–0.07)
Basophils Absolute: 0 10*3/uL (ref 0.0–0.1)
Basophils Relative: 1 %
Eosinophils Absolute: 0.1 10*3/uL (ref 0.0–0.5)
Eosinophils Relative: 1 %
HCT: 49.8 % (ref 39.0–52.0)
Hemoglobin: 17.9 g/dL — ABNORMAL HIGH (ref 13.0–17.0)
Immature Granulocytes: 0 %
Lymphocytes Relative: 34 %
Lymphs Abs: 1.9 10*3/uL (ref 0.7–4.0)
MCH: 30.4 pg (ref 26.0–34.0)
MCHC: 35.9 g/dL (ref 30.0–36.0)
MCV: 84.7 fL (ref 80.0–100.0)
Monocytes Absolute: 0.4 10*3/uL (ref 0.1–1.0)
Monocytes Relative: 8 %
Neutro Abs: 3.1 10*3/uL (ref 1.7–7.7)
Neutrophils Relative %: 56 %
Platelet Count: 210 10*3/uL (ref 150–400)
RBC: 5.88 MIL/uL — ABNORMAL HIGH (ref 4.22–5.81)
RDW: 12 % (ref 11.5–15.5)
WBC Count: 5.5 10*3/uL (ref 4.0–10.5)
nRBC: 0 % (ref 0.0–0.2)

## 2022-03-16 NOTE — Telephone Encounter (Signed)
Called patient to schedule f/u. Patient notified.  

## 2022-03-16 NOTE — Progress Notes (Signed)
[place] [phone]   Fax:([phone]) [place] NOTE  Patient Care Team: Cory Herter, NP as PCP - General (Nurse Practitioner) Cory Salines, DO as PCP - Cardiology (Cardiology)  CHIEF COMPLAINTS/PURPOSE OF CONSULTATION:  Polycythemia  HISTORY OF PRESENTING ILLNESS:  Cory Frazier 46 y.o. male with medical history significant for anxiety and asthma presents to the hematology clinic for polycythemia.   On review of the previous records, there is evidence of polycythemia as far back as May 2013. Most recent labs from 02/28/2022 showed hemoglobin 18.9, hematocrit 57.3%.   On exam today, Cory Frazier reports his energy levels are fairly stable. He does have some fatigue but is able to complete his ADLs on his own. He denies any recent appetite or weight changes. He denies nausea, vomiting or abdominal pain. His bowel habits are unchanged without any recurrent episodes of diarrhea or constipation. He reports having dizzy spells over the last couple of years without any syncopal episodes. He adds having decreased visual acuity which has been slowly progressive. He does snore regularly but has not undergone sleep study. He denies fevers, chills, sweats, shortness of breath, chest pain or cough. He has no other complaints. Rest of the 10 point ROS is below.   MEDICAL HISTORY:  Past Medical History:  Diagnosis Date   Anxiety    Asthma     SURGICAL HISTORY: History reviewed. No pertinent surgical history.  SOCIAL HISTORY: Social History   Socioeconomic History   Marital status: Single    Spouse name: Not on file   Number of children: Not on file   Years of education: Not on file   Highest education level: Not on file  Occupational History   Not on file  Tobacco Use   Smoking status: Never    Passive exposure: Never   Smokeless tobacco: Never  Vaping Use   Vaping Use: Never used  Substance and Sexual Activity   Alcohol use: Yes     Comment: Occasional    Drug use: No   Sexual activity: Not Currently  Other Topics Concern   Not on file  Social History Narrative   Not on file   Social Determinants of Health   Financial Resource Strain: Not on file  Food Insecurity: Not on file  Transportation Needs: Not on file  Physical Activity: Not on file  Stress: Not on file  Social Connections: Not on file  Intimate Partner Violence: Not on file    FAMILY HISTORY: Family History  Family history unknown: Yes    ALLERGIES:  is allergic to codeine and claritin [loratadine].  MEDICATIONS:  Current Outpatient Medications  Medication Sig Dispense Refill   aspirin 81 MG chewable tablet Chew 81 mg by mouth daily. Takes rarely     atorvastatin (LIPITOR) 20 MG tablet Take 1 tablet (20 mg total) by mouth daily. 30 tablet 2   hydrochlorothiazide (HYDRODIURIL) 12.5 MG tablet Take 2 tablets (25 mg total) by mouth daily. 90 tablet 0   losartan (COZAAR) 50 MG tablet Take 1 tablet (50 mg total) by mouth daily. 90 tablet 3   metoprolol tartrate (LOPRESSOR) 25 MG tablet Take 1 tablet (25 mg total) by mouth 2 (two) times daily. 180 tablet 0   No current facility-administered medications for this visit.    REVIEW OF SYSTEMS:   Constitutional: ( - ) fevers, ( - )  chills , ( - ) night sweats Eyes: ( - ) blurriness of vision, ( - ) double  vision, ( - ) watery eyes Ears, nose, mouth, throat, and face: ( - ) mucositis, ( - ) sore throat Respiratory: ( - ) cough, ( - ) dyspnea, ( - ) wheezes Cardiovascular: ( - ) palpitation, ( - ) chest discomfort, ( - ) lower extremity swelling Gastrointestinal:  ( - ) nausea, ( - ) heartburn, ( - ) change in bowel habits Skin: ( - ) abnormal skin rashes Lymphatics: ( - ) new lymphadenopathy, ( - ) easy bruising Neurological: ( - ) numbness, ( - ) tingling, ( - ) new weaknesses Behavioral/Psych: ( - ) mood change, ( - ) new changes  All other systems were reviewed with the patient and are  negative.  PHYSICAL EXAMINATION: ECOG PERFORMANCE STATUS: 1 - Symptomatic but completely ambulatory  Vitals:   03/16/22 1110  BP: 136/85  Pulse: 85  Resp: 18  Temp: 97.8 F (36.6 C)  SpO2: 100%   Filed Weights   03/16/22 1110  Weight: 221 lb 4.8 oz (100.4 kg)    GENERAL: well appearing male in NAD  SKIN: skin color, texture, turgor are normal, no rashes or significant lesions EYES: conjunctiva are pink and non-injected, sclera clear OROPHARYNX: no exudate, no erythema; lips, buccal mucosa, and tongue normal  NECK: supple, non-tender LYMPH:  no palpable lymphadenopathy in the cervical or supraclavicular lymph nodes.  LUNGS: clear to auscultation and percussion with normal breathing effort HEART: regular rate & rhythm and no murmurs and no lower extremity edema Musculoskeletal: no cyanosis of digits and no clubbing  PSYCH: alert & oriented x 3, fluent speech NEURO: no focal motor/sensory deficits  LABORATORY DATA:  I have reviewed the data as listed    Latest Ref Rng & Units 03/16/2022   12:16 PM 02/28/2022   10:43 AM 06/29/2020    4:35 PM  CBC  WBC 4.0 - 10.5 K/uL 5.5  8.6  6.6   Hemoglobin 13.0 - 17.0 g/dL 17.9  18.9  18.2   Hematocrit 39.0 - 52.0 % 49.8  57.3  53.2   Platelets 150 - 400 K/uL 210  263  231        Latest Ref Rng & Units 03/16/2022   12:16 PM 02/08/2022    3:31 PM 04/07/2021    3:35 PM  CMP  Glucose 70 - 99 mg/dL 111  82  96   BUN 6 - 20 mg/dL '13  19  18   '$ Creatinine 0.61 - 1.24 mg/dL 1.12  1.06  1.20   Sodium 135 - 145 mmol/L 134  138  145   Potassium 3.5 - 5.1 mmol/L 3.5  4.3  4.0   Chloride 98 - 111 mmol/L 99  99  100   CO2 22 - 32 mmol/L '30  24  20   '$ Calcium 8.9 - 10.3 mg/dL 9.9  10.4  10.2   Total Protein 6.5 - 8.1 g/dL 7.1  7.1    Total Bilirubin 0.3 - 1.2 mg/dL 0.7  0.5    Alkaline Phos 38 - 126 U/L 52  61    AST 15 - 41 U/L 25  21    ALT 0 - 44 U/L 36  21     RADIOGRAPHIC STUDIES: I have personally reviewed the radiological images  as listed and agreed with the findings in the report. ECHOCARDIOGRAM COMPLETE  Result Date: 03/15/2022    ECHOCARDIOGRAM REPORT   Patient Name:   Cory Frazier Date of Exam: 03/15/2022 Medical Rec #:  [id number]  Height:       66.0 in Accession #:    [id number]    Weight:       225.0 lb Date of Birth:  [birth year]     BSA:          2.102 m Patient Age:    29 years      BP:           122/98 mmHg Patient Gender: M             HR:           76 bpm. Exam Location:  [facility] Procedure: 2D Echo, Color Doppler and Cardiac Doppler Indications:    Chest Pain R07.9  History:        Patient has no prior history of Echocardiogram examinations.  Sonographer:    Mikki Santee RDCS Referring Phys: [id number] KARDIE TOBB  Sonographer Comments: Global longitudinal strain was attempted. IMPRESSIONS  1. Left ventricular ejection fraction, by estimation, is 55 to 60%. The left ventricle has normal function. The left ventricle has no regional wall motion abnormalities. Left ventricular diastolic parameters were normal.  2. Right ventricular systolic function is normal. The right ventricular size is normal. Tricuspid regurgitation signal is inadequate for assessing PA pressure.  3. The mitral valve is normal in structure. Trivial mitral valve regurgitation. No evidence of mitral stenosis.  4. The aortic valve is tricuspid. Aortic valve regurgitation is not visualized. No aortic stenosis is present.  5. The inferior vena cava is normal in size with greater than 50% respiratory variability, suggesting right atrial pressure of 3 mmHg. Comparison(s): No prior Echocardiogram. FINDINGS  Left Ventricle: Left ventricular ejection fraction, by estimation, is 55 to 60%. The left ventricle has normal function. The left ventricle has no regional wall motion abnormalities. 3D left ventricular ejection fraction analysis performed but not reported based on interpreter judgement due to suboptimal tracking. The left ventricular internal cavity  size was normal in size. There is no left ventricular hypertrophy. Left ventricular diastolic parameters were normal. Right Ventricle: The right ventricular size is normal. No increase in right ventricular wall thickness. Right ventricular systolic function is normal. Tricuspid regurgitation signal is inadequate for assessing PA pressure. Left Atrium: Left atrial size was normal in size. Right Atrium: Right atrial size was normal in size. Pericardium: There is no evidence of pericardial effusion. Mitral Valve: The mitral valve is normal in structure. Trivial mitral valve regurgitation. No evidence of mitral valve stenosis. Tricuspid Valve: The tricuspid valve is normal in structure. Tricuspid valve regurgitation is not demonstrated. Aortic Valve: The aortic valve is tricuspid. Aortic valve regurgitation is not visualized. No aortic stenosis is present. Pulmonic Valve: The pulmonic valve was normal in structure. Pulmonic valve regurgitation is trivial. Aorta: The aortic root and ascending aorta are structurally normal, with no evidence of dilitation. Venous: The inferior vena cava is normal in size with greater than 50% respiratory variability, suggesting right atrial pressure of 3 mmHg. IAS/Shunts: The atrial septum is grossly normal.  LEFT VENTRICLE PLAX 2D LVIDd:         4.80 cm   Diastology LVIDs:         2.90 cm   LV e' medial:    7.62 cm/s LV PW:         0.90 cm   LV E/e' medial:  7.6 LV IVS:        0.90 cm   LV e' lateral:   12.00 cm/s LVOT diam:  2.30 cm   LV E/e' lateral: 4.8 LV SV:         72 LV SV Index:   34 LVOT Area:     4.15 cm                           3D Volume EF:                          3D EF:        58 %                          LV EDV:       115 ml                          LV ESV:       49 ml                          LV SV:        66 ml RIGHT VENTRICLE RV Basal diam:  2.70 cm RV Mid diam:    2.40 cm RV S prime:     7.72 cm/s TAPSE (M-mode): 1.9 cm LEFT ATRIUM             Index        RIGHT  ATRIUM           Index LA diam:        3.70 cm 1.76 cm/m   RA Area:     14.70 cm LA Vol (A2C):   46.7 ml 22.21 ml/m  RA Volume:   36.70 ml  17.46 ml/m LA Vol (A4C):   36.0 ml 17.12 ml/m LA Biplane Vol: 41.2 ml 19.60 ml/m  AORTIC VALVE LVOT Vmax:   84.90 cm/s LVOT Vmean:  55.000 cm/s LVOT VTI:    0.174 m  AORTA Ao Root diam: 3.00 cm Ao Asc diam:  3.30 cm MITRAL VALVE MV Area (PHT): 3.85 cm    SHUNTS MV Decel Time: 197 msec    Systemic VTI:  0.17 m MV E velocity: 57.60 cm/s  Systemic Diam: 2.30 cm MV A velocity: 57.10 cm/s MV E/A ratio:  1.01 Gwyndolyn Kaufman MD Electronically signed by Gwyndolyn Kaufman MD Signature Date/Time: 03/15/2022/3:19:53 PM    Final     ASSESSMENT & PLAN Cory Frazier is a 46 y.o. male who presents to the hematology clinic for evaluation of polycythemia.   There are two types of polycythemia, Primary polycythemia and secondary polycythemia. Primary polycythemia is overproduction of red blood cells due to a driver mutation. The most common mutation is the JAK2 V617F (95% of cases), but there are other mutations which can cause this disorder. Primary polycythemia is a myeloproliferative neoplasm which may require cytoreductive therapy to decrease risk of thrombosis. This can consist of medications or phlebotomy to drive down the red blood cell counts. Secondary polycythemia is polycythemia driven by low oxygen levels. This represents an appropriate response of the body attempting to increase red cell volume. Causes of secondary polycythemia include smoking (most common), obstructive sleep apnea (OSA), or living at altitude. This can also be caused by testosterone supplementation. Certain thalassemias can present with marked erythrocytosis , but normal hemoglobin. Secondary polycythemia does not have the same level of thrombotic risk and therefore does not require cytoreductive therapy  or phlebotomy.   #Polycythemia: --workup to include CBC, CMP and erythropoietin level    --patient is a non smoker and does not use any testosterone containing products --patient has symptoms concerning for sleep apnea, will refer to sleep center for polysomnography --will order MPN workup to include JAK2 with reflex and BCR/ABL FISH --RTC in 6 months or sooner if indicated by the above labs.    Orders Placed This Encounter  Procedures   CBC with Differential ([facility] Only)    Standing Status:   Future    Number of Occurrences:   1    Standing Expiration Date:   03/17/2023   CMP ([facility] only)    Standing Status:   Future    Number of Occurrences:   1    Standing Expiration Date:   03/17/2023   Erythropoietin    Standing Status:   Future    Number of Occurrences:   1    Standing Expiration Date:   03/16/2023   JAK2 (INCLUDING V617F AND EXON 12), MPL,& CALR W/RFL MPN PANEL ([facility])    Standing Status:   Future    Number of Occurrences:   1    Standing Expiration Date:   03/16/2023   BCR ABL1 FISH ([facility])    Standing Status:   Future    Number of Occurrences:   1    Standing Expiration Date:   03/17/2023    All questions were answered. The patient knows to call the clinic with any problems, questions or concerns.  I have spent a total of 60 minutes minutes of face-to-face and non-face-to-face time, preparing to see the patient, obtaining and/or reviewing separately obtained history, performing a medically appropriate examination, counseling and educating the patient, ordering tests/procedures, referring and communicating with other health care professionals, documenting clinical information in the electronic health record,  and care coordination.   Dede Query, PA-C Department of Hematology/Oncology [place] at [facility] Phone: [phone]  Patient was seen with Dr. Lorenso Courier  I have read the above note and personally examined the patient. I agree with the assessment and plan as noted above.  Briefly Cory Frazier is a  46 year old male who presents for evaluation of polycythemia.  He is a non-smoker and does not use any exogenous testosterone.  He does have signs and symptoms concerning for obstructive sleep apnea.  Will make referral for sleep study for further evaluation.  Additionally we will rule out MPN by testing a JAK2 MPN panel with reflex as well as BCR/ABL FISH.  The patient voiced understanding of our plan moving forward.  Will plan to see him back in 6 months time assuming he has obstructive sleep apnea, however if he is found to have an MPN we will return him to clinic sooner.   Ledell Peoples, MD Department of Hematology/Oncology [place] at [facility] Phone: [phone] Pager: [phone] Email: [email or web address]

## 2022-03-16 NOTE — Telephone Encounter (Signed)
11/30/2020 per MD note: Discussed compliance - patient to start zoloft 25 mg   04/07/2021: Reports never took Zoloft, still has medication at home. Reports has jitters and feeling weird. Concern for taking medications because may cause side effect.   09/08/2021: Reports still not taking Sertraline. Reports he feels improved without medication. Reports does feel he has panic attacks sometimes as if clothes are sticking to him. Not ready for referral to Psychiatry.   02/08/22 5. Sense of impending doom - Patient denies thoughts of self-harm, suicidal ideations, homicidal ideations. - Sertraline discontinued per patient preference.  - Hydroxyzine as prescribed. Counseled on medication adherence/adverse effects.   The following day patient called to advise PCP that he was not going to take hydroxyzine, due to SE. 02/09/22  Chief Complaint: wants to notify PCP he is not going to take hydroxyzine 25 mg due to possible SE. Requesting referral to psychiatrist. Symptoms: reports medication prescribed hydroxyzine is antihistamine and when patient takes antihistamines in the past it causes "jittery" feeling and feels it would be counterproductive to start.   Provider recommends patient wait until behavioral health appointment on 03/29/22, for further evaluation of a therapeutic regimen to help with management of anxiety.

## 2022-03-17 ENCOUNTER — Encounter: Payer: Self-pay | Admitting: Cardiology

## 2022-03-17 ENCOUNTER — Ambulatory Visit: Payer: Medicaid Other | Attending: Cardiology | Admitting: Cardiology

## 2022-03-17 VITALS — BP 124/75 | HR 97 | Ht 66.0 in | Wt 219.6 lb

## 2022-03-17 DIAGNOSIS — E669 Obesity, unspecified: Secondary | ICD-10-CM

## 2022-03-17 DIAGNOSIS — E782 Mixed hyperlipidemia: Secondary | ICD-10-CM

## 2022-03-17 DIAGNOSIS — I10 Essential (primary) hypertension: Secondary | ICD-10-CM | POA: Diagnosis not present

## 2022-03-17 LAB — ERYTHROPOIETIN: Erythropoietin: 6.3 m[IU]/mL (ref 2.6–18.5)

## 2022-03-17 MED ORDER — PROPRANOLOL HCL 20 MG PO TABS: 20.0000 mg | ORAL_TABLET | Freq: Two times a day (BID) | ORAL | 3 refills | Status: DC

## 2022-03-17 NOTE — Patient Instructions (Signed)
Medication Instructions:  Your physician has recommended you make the following change in your medication:  STOP: Lopressor  START: Propanolol '20mg'$  TWICE DAILY.  *If you need a refill on your cardiac medications before your next appointment, please call your pharmacy*   Lab Work: NONE If you have labs (blood work) drawn today and your tests are completely normal, you will receive your results only by: [facility] (if you have MyChart) OR A paper copy in the mail If you have any lab test that is abnormal or we need to change your treatment, we will call you to review the results.   Testing/Procedures: NONE   Follow-Up: At [facility], you and your health needs are our priority.  As part of our continuing mission to provide you with exceptional heart care, we have created designated Provider Care Teams.  These Care Teams include your primary Cardiologist (physician) and Advanced Practice Providers (APPs -  Physician Assistants and Nurse Practitioners) who all work together to provide you with the care you need, when you need it.  We recommend signing up for the patient portal called "MyChart".  Sign up information is provided on this After Visit Summary.  MyChart is used to connect with patients for Virtual Visits (Telemedicine).  Patients are able to view lab/test results, encounter notes, upcoming appointments, etc.  Non-urgent messages can be sent to your provider as well.   To learn more about what you can do with MyChart, go to [email or web address].    Your next appointment:   6 month(s)  The format for your next appointment:   In Person  Provider:   Berniece Salines, DO

## 2022-03-18 NOTE — Progress Notes (Signed)
Cardiology Office Note:    Date:  03/20/2022   ID:  [name], DOB 1975-09-30, MRN [id number]  PCP:  Rema Fendt, NP  Cardiologist:  Thomasene Ripple, DO  Electrophysiologist:  None   Referring MD: Rema Fendt, NP     History of Present Illness:    Cory Frazier is a 46 y.o. male with a hx of hypertension, morbid obesity, vitamin D deficiency, anxiety and depression. I saw the patient for the first time on 02/21/2022 at that visit he was experiencing chest pain, palpitations and shortness of breath.  I placed a monitor the patient for 14 days.  Get an echocardiogram.  Chest pain I suspect there was associated with his palpitations.  He was also hypotensive the day in the office I started him on losartan continue his Lopressor and hydrochlorothiazide.  We repeated his lipid profile which was elevated from 2020.  Since also the patient he had been seen in our office he was seen by hounding at that time he reported that he had a rash and his losartan was cut back down to 25 mg.  His hydrochlorothiazide was increased to 25.  He was still experiencing spikes in his blood pressure.  He has called several times and we have adjusted his medication multiple times.  He brings his blood pressure reading with me which appears to be normal.   Past Medical History:  Diagnosis Date   Anxiety    Asthma     No past surgical history on file.  Current Medications: Current Meds  Medication Sig   aspirin 81 MG chewable tablet Chew 81 mg by mouth daily. Takes rarely   atorvastatin (LIPITOR) 20 MG tablet Take 1 tablet (20 mg total) by mouth daily.   hydrochlorothiazide (HYDRODIURIL) 12.5 MG tablet Take 2 tablets (25 mg total) by mouth daily.   losartan (COZAAR) 50 MG tablet Take 1 tablet (50 mg total) by mouth daily.   propranolol (INDERAL) 20 MG tablet Take 1 tablet (20 mg total) by mouth 2 (two) times daily.   [DISCONTINUED] metoprolol tartrate (LOPRESSOR) 25 MG tablet Take 1 tablet (25 mg  total) by mouth 2 (two) times daily.     Allergies:   Codeine and Claritin [loratadine]   Social History   Socioeconomic History   Marital status: Single    Spouse name: Not on file   Number of children: Not on file   Years of education: Not on file   Highest education level: Not on file  Occupational History   Not on file  Tobacco Use   Smoking status: Never    Passive exposure: Never   Smokeless tobacco: Never  Vaping Use   Vaping Use: Never used  Substance and Sexual Activity   Alcohol use: Yes    Comment: Occasional    Drug use: No   Sexual activity: Not Currently  Other Topics Concern   Not on file  Social History Narrative   Not on file   Social Determinants of Health   Financial Resource Strain: Not on file  Food Insecurity: Not on file  Transportation Needs: Not on file  Physical Activity: Not on file  Stress: Not on file  Social Connections: Not on file     Family History: The patient's Family history is unknown by patient.  ROS:   Review of Systems  Constitution: Negative for decreased appetite, fever and weight gain.  HENT: Negative for congestion, ear discharge, hoarse voice and sore throat.   Eyes: Negative  for discharge, redness, vision loss in right eye and visual halos.  Cardiovascular: Negative for chest pain, dyspnea on exertion, leg swelling, orthopnea and palpitations.  Respiratory: Negative for cough, hemoptysis, shortness of breath and snoring.   Endocrine: Negative for heat intolerance and polyphagia.  Hematologic/Lymphatic: Negative for bleeding problem. Does not bruise/bleed easily.  Skin: Negative for flushing, nail changes, rash and suspicious lesions.  Musculoskeletal: Negative for arthritis, joint pain, muscle cramps, myalgias, neck pain and stiffness.  Gastrointestinal: Negative for abdominal pain, bowel incontinence, diarrhea and excessive appetite.  Genitourinary: Negative for decreased libido, genital sores and incomplete  emptying.  Neurological: Negative for brief paralysis, focal weakness, headaches and loss of balance.  Psychiatric/Behavioral: Negative for altered mental status, depression and suicidal ideas.  Allergic/Immunologic: Negative for HIV exposure and persistent infections.    EKGs/Labs/Other Studies Reviewed:    The following studies were reviewed today:   EKG:  The ekg ordered today demonstrates   TTE 03/15/2022 1. Left ventricular ejection fraction, by estimation, is 55 to 60%. The  left ventricle has normal function. The left ventricle has no regional  wall motion abnormalities. Left ventricular diastolic parameters were  normal.   2. Right ventricular systolic function is normal. The right ventricular  size is normal. Tricuspid regurgitation signal is inadequate for assessing  PA pressure.   3. The mitral valve is normal in structure. Trivial mitral valve  regurgitation. No evidence of mitral stenosis.   4. The aortic valve is tricuspid. Aortic valve regurgitation is not  visualized. No aortic stenosis is present.   5. The inferior vena cava is normal in size with greater than 50%  respiratory variability, suggesting right atrial pressure of 3 mmHg.   Comparison(s): No prior Echocardiogram.   FINDINGS   Left Ventricle: Left ventricular ejection fraction, by estimation, is 55  to 60%. The left ventricle has normal function. The left ventricle has no  regional wall motion abnormalities. 3D left ventricular ejection fraction  analysis performed but not  reported based on interpreter judgement due to suboptimal tracking. The  left ventricular internal cavity size was normal in size. There is no left  ventricular hypertrophy. Left ventricular diastolic parameters were  normal.   Right Ventricle: The right ventricular size is normal. No increase in  right ventricular wall thickness. Right ventricular systolic function is  normal. Tricuspid regurgitation signal is inadequate for  assessing PA  pressure.   Left Atrium: Left atrial size was normal in size.   Right Atrium: Right atrial size was normal in size.   Pericardium: There is no evidence of pericardial effusion.   Mitral Valve: The mitral valve is normal in structure. Trivial mitral  valve regurgitation. No evidence of mitral valve stenosis.   Tricuspid Valve: The tricuspid valve is normal in structure. Tricuspid  valve regurgitation is not demonstrated.   Aortic Valve: The aortic valve is tricuspid. Aortic valve regurgitation is  not visualized. No aortic stenosis is present.   Pulmonic Valve: The pulmonic valve was normal in structure. Pulmonic valve  regurgitation is trivial.   Aorta: The aortic root and ascending aorta are structurally normal, with  no evidence of dilitation.   Venous: The inferior vena cava is normal in size with greater than 50%  respiratory variability, suggesting right atrial pressure of 3 mmHg.   IAS/Shunts: The atrial septum is grossly normal.    Recent Labs: 02/28/2022: TSH 1.470 03/16/2022: ALT 36; BUN 13; Creatinine 1.12; Hemoglobin 17.9; Platelet Count 210; Potassium 3.5; Sodium 134  Recent  Lipid Panel    Component Value Date/Time   CHOL 228 (H) 02/28/2022 1043   TRIG 211 (H) 02/28/2022 1043   HDL 43 02/28/2022 1043   CHOLHDL 5.3 (H) 02/28/2022 1043   LDLCALC 147 (H) 02/28/2022 1043    Physical Exam:    VS:  BP 124/75   Pulse 97   Ht 5\' 6"  (1.676 m)   Wt 219 lb 9.6 oz (99.6 kg)   SpO2 98%   BMI 35.44 kg/m     Wt Readings from Last 3 Encounters:  03/17/22 219 lb 9.6 oz (99.6 kg)  03/16/22 221 lb 4.8 oz (100.4 kg)  03/03/22 225 lb (102.1 kg)     GEN: Well nourished, well developed in no acute distress HEENT: Normal NECK: No JVD; No carotid bruits LYMPHATICS: No lymphadenopathy CARDIAC: S1S2 noted,RRR, no murmurs, rubs, gallops RESPIRATORY:  Clear to auscultation without rales, wheezing or rhonchi  ABDOMEN: Soft, non-tender, non-distended,  +bowel sounds, no guarding. EXTREMITIES: No edema, No cyanosis, no clubbing MUSCULOSKELETAL:  No deformity  SKIN: Warm and dry NEUROLOGIC:  Alert and oriented x 3, non-focal PSYCHIATRIC:  Normal affect, good insight  ASSESSMENT:    1. Mixed hyperlipidemia   2. Essential (primary) hypertension   3. Obesity (BMI 30-39.9)    PLAN:     Today in office we discussed his echocardiogram result.  One of the big things that we discussed is his anxiety.  Which I think is clearly a contributor to his blood pressure spikes at times because he goes into panic attacks significant anxiety attacks.  He is not being treated medically.  He has had some therapy."  He needs to really be treated.  What I am going to do is transitioning him off of Lopressor to propranolol which will hopefully help with his anxiety as well.  He had many questions in office which has been answered.  The patient understands the need to lose weight with diet and exercise. We have discussed specific strategies for this.  Continue Lipitor 20 mg daily for hyperlipidemia.  The patient is in agreement with the above plan. The patient left the office in stable condition.  The patient will follow up in 6 months or sooner if needed.   Medication Adjustments/Labs and Tests Ordered: Current medicines are reviewed at length with the patient today.  Concerns regarding medicines are outlined above.  No orders of the defined types were placed in this encounter.  Meds ordered this encounter  Medications   propranolol (INDERAL) 20 MG tablet    Sig: Take 1 tablet (20 mg total) by mouth 2 (two) times daily.    Dispense:  180 tablet    Refill:  3    Patient Instructions  Medication Instructions:  Your physician has recommended you make the following change in your medication:  STOP: Lopressor  START: Propanolol 20mg  TWICE DAILY.  *If you need a refill on your cardiac medications before your next appointment, please call your  pharmacy*   Lab Work: NONE If you have labs (blood work) drawn today and your tests are completely normal, you will receive your results only by: MyChart Message (if you have MyChart) OR A paper copy in the mail If you have any lab test that is abnormal or we need to change your treatment, we will call you to review the results.   Testing/Procedures: NONE   Follow-Up: At [facility], you and your health needs are our priority.  As part of our continuing mission to  provide you with exceptional heart care, we have created designated Provider Care Teams.  These Care Teams include your primary Cardiologist (physician) and Advanced Practice Providers (APPs -  Physician Assistants and Nurse Practitioners) who all work together to provide you with the care you need, when you need it.  We recommend signing up for the patient portal called "MyChart".  Sign up information is provided on this After Visit Summary.  MyChart is used to connect with patients for Virtual Visits (Telemedicine).  Patients are able to view lab/test results, encounter notes, upcoming appointments, etc.  Non-urgent messages can be sent to your provider as well.   To learn more about what you can do with MyChart, go to [email or web address].    Your next appointment:   6 month(s)  The format for your next appointment:   In Person  Provider:   Thomasene Ripple, DO     Adopting a Healthy Lifestyle.  Know what a healthy weight is for you (roughly BMI <25) and aim to maintain this   Aim for 7+ servings of fruits and vegetables daily   65-80+ fluid ounces of water or unsweet tea for healthy kidneys   Limit to max 1 drink of alcohol per day; avoid smoking/tobacco   Limit animal fats in diet for cholesterol and heart health - choose grass fed whenever available   Avoid highly processed foods, and foods high in saturated/trans fats   Aim for low stress - take time to unwind and care for your mental health    Aim for 150 min of moderate intensity exercise weekly for heart health, and weights twice weekly for bone health   Aim for 7-9 hours of sleep daily   When it comes to diets, agreement about the perfect plan isnt easy to find, even among the experts. Experts at the [facility] [employer] developed an idea known as the Healthy Eating Plate. Just imagine a plate divided into logical, healthy portions.   The emphasis is on diet quality:   Load up on vegetables and fruits - one-half of your plate: Aim for color and variety, and remember that potatoes dont count.   Go for whole grains - one-quarter of your plate: Whole wheat, barley, wheat berries, quinoa, oats, brown rice, and foods made with them. If you want pasta, go with whole wheat pasta.   Protein power - one-quarter of your plate: Fish, chicken, beans, and nuts are all healthy, versatile protein sources. Limit red meat.   The diet, however, does go beyond the plate, offering a few other suggestions.   Use healthy plant oils, such as olive, canola, soy, corn, sunflower and peanut. Check the labels, and avoid partially hydrogenated oil, which have unhealthy trans fats.   If youre thirsty, drink water. Coffee and tea are good in moderation, but skip sugary drinks and limit milk and dairy products to one or two daily servings.   The type of carbohydrate in the diet is more important than the amount. Some sources of carbohydrates, such as vegetables, fruits, whole grains, and beans-are healthier than others.   Finally, stay active  Signed, Thomasene Ripple, DO  03/20/2022 1:58 PM    [facility]

## 2022-03-22 DIAGNOSIS — F411 Generalized anxiety disorder: Secondary | ICD-10-CM | POA: Diagnosis not present

## 2022-03-24 ENCOUNTER — Encounter: Payer: Self-pay | Admitting: Physician Assistant

## 2022-03-24 ENCOUNTER — Telehealth: Payer: Self-pay | Admitting: Physician Assistant

## 2022-03-24 DIAGNOSIS — F419 Anxiety disorder, unspecified: Secondary | ICD-10-CM

## 2022-03-24 LAB — JAK2 (INCLUDING V617F AND EXON 12), MPL,& CALR W/RFL MPN PANEL (NGS)

## 2022-03-24 LAB — BCR ABL1 FISH (GENPATH)

## 2022-03-24 NOTE — Telephone Encounter (Signed)
I called Mr. Poth to review the lab results from 03/16/2022. There is mild polycythemia with Hgb 17.9. No evidence of MPN or BCR/ABL translocation. Recommend sleep study to evaluate for OSA as underlying cause. Referral is placed. We will plan to see patient in 6 months to monitor labs.   Patient requested psych referral due to uncontrolled anxiety. I will make a referral to Dr. Conception Chancy.

## 2022-03-25 ENCOUNTER — Telehealth: Payer: Self-pay | Admitting: Cardiology

## 2022-03-25 NOTE — Telephone Encounter (Signed)
Pt c/o medication issue:  1. Name of Medication:  propranolol (INDERAL) 20 MG tablet  2. How are you currently taking this medication (dosage and times per day)?  Twice daily by mouth   3. Are you having a reaction (difficulty breathing--STAT)?   4. What is your medication issue?  Patient states with starting on this medication his HR has been remaining alarmingly low for him ranging 50's-60's. He worries that it may be dropping even lower while he's sleeping. He states that even while walking, his HR remains in the low 60's. Patient also mentions that a few days ago he felt a heaviness in his arms, as if he were walking in water. He states this may have been a panic attack because he has then relatively often. He states that he was unable to take it HR at that time because he was not at home.

## 2022-03-25 NOTE — Telephone Encounter (Signed)
Spoke to patient he stated he started taking Propranolol 20 mg twice a day 8 days ago.Stated his B/P has been better.He is concerned pulse has been ranging in 50's, 52 lowest.Patient reassured pulse in 50's is ok.Advised to continue to monitor B/P and pulse daily,keep a log.Advised to bring readings to Pharm D appointment already scheduled 1/12 at 2:30 pm.I will make Dr.Tobb aware.

## 2022-03-28 ENCOUNTER — Other Ambulatory Visit: Payer: Self-pay | Admitting: Physician Assistant

## 2022-03-28 DIAGNOSIS — F419 Anxiety disorder, unspecified: Secondary | ICD-10-CM

## 2022-03-28 NOTE — Telephone Encounter (Signed)
Spoke to patient Dr.Tobb's advice given.He will decrease Propranolol to 10 mg twice a day.Advised to keep appointment with Pharm D as planned.

## 2022-03-29 ENCOUNTER — Ambulatory Visit (HOSPITAL_BASED_OUTPATIENT_CLINIC_OR_DEPARTMENT_OTHER): Payer: Medicaid Other | Admitting: Psychiatry

## 2022-03-29 ENCOUNTER — Encounter (HOSPITAL_COMMUNITY): Payer: Self-pay | Admitting: Psychiatry

## 2022-03-29 DIAGNOSIS — F411 Generalized anxiety disorder: Secondary | ICD-10-CM | POA: Diagnosis not present

## 2022-03-29 DIAGNOSIS — F410 Panic disorder [episodic paroxysmal anxiety] without agoraphobia: Secondary | ICD-10-CM

## 2022-03-29 MED ORDER — GABAPENTIN 100 MG PO CAPS: 100.0000 mg | ORAL_CAPSULE | Freq: Three times a day (TID) | ORAL | 2 refills | Status: DC | PRN

## 2022-03-29 MED ORDER — ESCITALOPRAM OXALATE 5 MG PO TABS: 5.0000 mg | ORAL_TABLET | Freq: Every day | ORAL | 1 refills | Status: DC

## 2022-03-29 NOTE — Progress Notes (Signed)
Psychiatric Initial Adult Assessment   Patient Identification: Cory Frazier MRN:  [id number] Date of Evaluation:  03/29/2022 Referral Source: Oncology Chief Complaint:   Chief Complaint  Patient presents with   Establish Care   Anxiety   Visit Diagnosis:    ICD-10-CM   1. GAD (generalized anxiety disorder)  F41.1 escitalopram (LEXAPRO) 5 MG tablet    gabapentin (NEURONTIN) 100 MG capsule    2. Panic anxiety syndrome  F41.0        Assessment:  Cory Frazier is a 47 y.o. male with a history of vitamin D deficiency and anxiety who presents virtually to [place] at [facility] for initial evaluation on 03/29/2022.  Patient reports significant symptoms of anxiety including feeling nervous or on edge, being unable to stop controlled worrying, difficulty relaxing, restlessness, irritability, and fear that something awful might happen.  He can also experience physical symptoms of anxiety including palpitations, shortness of breath, chest pain, and tingling.  Patient's symptoms tend to be primarily focused around his medical health and his fear of death.  At times it can progressed to the point of panic attacks where he has presented to the ED.  Patient has used alcohol as a coping mechanism in the past however was not sober since 2015.  Patient meets criteria for generalized anxiety disorder with several symptoms of panic disorder.  He would benefit from discontinuing Xanax and starting a longer acting anxiolytic medication and was place in addition to a safer prn option.  Patient also could benefit from continuing with therapy to work on International aid/development worker and coping mechanisms to deal with his anxiety symptoms.  A number of assessments were performed during the evaluation today including PHQ-9 which they scored a 8 on, GAD-7 which they scored a 14 on, and [place] suicide severity screening which showed no risk.  Based on these assessments patient would benefit from  medication adjustment to better target their symptoms.  Plan: - Start Lexapro 5 mg QD - Gabapentin 100 mg TID prn for anxiety - Discontinue Xanax - Recommend sleep study to rule out OSA - CMP, CBC, lipid profile, Vit D, TSH reviewed - Continue therapy every other week with Arlyce Harman - Crisis resources reviewed - Follow up in a month  History of Present Illness: Cory Frazier presents reporting that he has had a lot of difficulty with anxiety and panic dating back to when he was 58.  Patient notes that his anxiety/panic symptoms tend to be related to his health and fear that he is going to die.  He remembers been he was 66 and asked his friends to listen to his heart as he thought it was working abnormally.  In 2002 patient went to the ER for the due to fear of a heart attack and it was at that point that he was connected with a therapist for the first time.  He continued this for a little while before falling off.  In 2007 patient connected with a psychiatrist who prescribed Xanax extended release which he took twice a day for several years.  [name] found the Xanax to be helpful in managing his anxiety symptoms.  He did however continue to go to the ER a few times due to concerns of chest pain and potential heart attack which were often diagnosed as panic attacks.  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  He notes that after falling off with his psychiatrist he started taking his grandmother's Xanax, around 0.25 mg, twice a  day until 2016 at which point he stopped.  Around a month and a half ago patient reports that he resumed the Xanax use with some leftover she had after her passing and is using it as needed.  Patient reports that he is able to go 5 days without it sometimes and other days needs it multiple days in a row.  On exploration of patient's symptoms he endorses experiencing both physical and mental signs of anxiety.  He endorses a baseline anxiety that can flare up into panic  attacks unexpectedly.  He notes that he has difficulty controlling his worrying as well as difficulty relaxing, and is often fearful that something awful might happen.  In addition to trying the Xanax patient had also used alcohol in the past which she believes was a coping mechanism however stopped due to concerns that it was affecting her negatively.  He also has tried talking himself down breathing exercises with minimal success in the past.  Patient notes that he does have strong support network of friends/girlfriend who are related to spend time with him and talked him down during periods of anxiety.  [name] does note that he started to feel like a burden however as he feels like they had to constantly babysit him and he can never be by himself.  Of note patient reports that he grew up with his grandparents his whole life and was often exposed to people being sick.  He is unsure at this is part of the reason why he is so focused on his own health and fear of dying.  When he was 83 and his younger brother was murdered which also affected him.  Treatment options were discussed including medications and therapy.  Patient was open to therapy and notes having connected with a provider last week he plans to see every other week.  In regards to medication patient did have some interest in trying alternatives however expressed significant anxiety about new medications.  Of note he often raised side effects of the medication before he tries them and this can lead to him not trying medication or over focusing on developing the side effects he read.  We discussed this and encouraged patient to do his best to not obsess on potential side effects.  We also encouraged him to try alternative medications for long-term anxiety coverage in addition to explain the negative effects of regular benzodiazepine use.  Patient was recommended to discontinue his as needed use of his grandmother Xanax.  We discussed starting  gabapentin as needed in its place.  Patient agreed to start on Lexapro after going over the risk and benefits however was unsure about starting gabapentin.  Associated Signs/Symptoms: Depression Symptoms:  depressed mood, anhedonia, feelings of worthlessness/guilt, difficulty concentrating, anxiety, disturbed sleep, weight loss, (Hypo) Manic Symptoms:   Denies Anxiety Symptoms:  Excessive Worry, Specific Phobias, Psychotic Symptoms:   Denies PTSD Symptoms: NA  Past Psychiatric History: Denies prior suicide attempts or psychiatric hospitalizations.  Had seen in [facility] for psychiatry in the past. He had prescribed extended release Xanax which the patient took for a couple years. His grandmother was prescribed Xanax which Alessandro would take intermittently until 2016. He restarted taking Xanax a few weeks ago that were left over from his grandmother and he takes them as needed. He used it roughly 5 times in a week.   He denies trying any other psychiatric medications.  Marijuana he stopped in 2011 and alcohol he stopped in 2015.   Previous  Psychotropic Medications: Yes   Substance Abuse History in the last 12 months:  Yes.    Consequences of Substance Abuse: NA  Past Medical History:  Past Medical History:  Diagnosis Date   Anxiety    Asthma    No past surgical history on file.  Family Psychiatric History: Mom had alcohol use disorder  Family History:  Family History  Family history unknown: Yes    Social History:   Social History   Socioeconomic History   Marital status: Single    Spouse name: Not on file   Number of children: Not on file   Years of education: Not on file   Highest education level: Not on file  Occupational History   Not on file  Tobacco Use   Smoking status: Never    Passive exposure: Never   Smokeless tobacco: Never  Vaping Use   Vaping Use: Never used  Substance and Sexual Activity   Alcohol use: Yes    Comment: Occasional     Drug use: No   Sexual activity: Not Currently  Other Topics Concern   Not on file  Social History Narrative   Not on file   Social Determinants of Health   Financial Resource Strain: Not on file  Food Insecurity: Not on file  Transportation Needs: Not on file  Physical Activity: Not on file  Stress: Not on file  Social Connections: Not on file    Additional Social History: Grew up with his grandparents and 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  Father is not involved in his life and mother is struggling with alcohol use or other mental health issues.  Patient was married for 3 years and 2006 2009.  Currently he lives with his roomate/girlfriend.  Allergies:   Allergies  Allergen Reactions   Codeine Other (See Comments)    unknown unknown   Claritin [Loratadine] Anxiety    Metabolic Disorder Labs: Lab Results  Component Value Date   HGBA1C 5.3 02/28/2022   No results found for: "PROLACTIN" Lab Results  Component Value Date   CHOL 228 (H) 02/28/2022   TRIG 211 (H) 02/28/2022   HDL 43 02/28/2022   CHOLHDL 5.3 (H) 02/28/2022   LDLCALC 147 (H) 02/28/2022   LDLCALC 141 (H) 06/29/2020   Lab Results  Component Value Date   TSH 1.470 02/28/2022    Therapeutic Level Labs: No results found for: "LITHIUM" No results found for: "CBMZ" No results found for: "VALPROATE"  Current Medications: Current Outpatient Medications  Medication Sig Dispense Refill   escitalopram (LEXAPRO) 5 MG tablet Take 1 tablet (5 mg total) by mouth daily. 30 tablet 1   gabapentin (NEURONTIN) 100 MG capsule Take 1 capsule (100 mg total) by mouth 3 (three) times daily as needed. 90 capsule 2   aspirin 81 MG chewable tablet Chew 81 mg by mouth daily. Takes rarely     atorvastatin (LIPITOR) 20 MG tablet Take 1 tablet (20 mg total) by mouth daily. 30 tablet 2   hydrochlorothiazide (HYDRODIURIL) 12.5 MG tablet Take 2 tablets (25 mg total) by mouth daily. 90 tablet 0    losartan (COZAAR) 50 MG tablet Take 1 tablet (50 mg total) by mouth daily. 90 tablet 3   propranolol (INDERAL) 20 MG tablet Take 1/2 tablet ( 10 mg ) twice a day     No current facility-administered medications for this visit.    Psychiatric Specialty Exam: Review of Systems  There were no vitals  taken for this visit.There is no height or weight on file to calculate BMI.  General Appearance: Fairly Groomed  Eye Contact:  Good  Speech:  Clear and Coherent and Normal Rate  Volume:  Normal  Mood:  Anxious  Affect:  Congruent  Thought Process:  Coherent  Orientation:  Full (Time, Place, and Person)  Thought Content:  Obsessions and Rumination  Suicidal Thoughts:  No  Homicidal Thoughts:  No  Memory:  Immediate;   Good  Judgement:  Fair  Insight:  Lacking  Psychomotor Activity:  Normal  Concentration:  Concentration: Good  Recall:  Good  Fund of Knowledge:Fair  Language: Good  Akathisia:  NA    AIMS (if indicated):  not done  Assets:  Communication Skills Desire for Improvement Housing Social Support Transportation  ADL's:  Intact  Cognition: WNL  Sleep:  Fair   Screenings: GAD-7    [place] Office Visit from 03/29/2022 in [facility] Visit from 02/28/2022 in [facility] at [place] Visit from 01/05/2021 in [place] at [facility]  Total GAD-7 Score '14 13 14      '$ PHQ2-9    [place] Visit from 03/29/2022 in [facility] Visit from 02/28/2022 in [place] at [place] Visit from 02/08/2022 in [place] at [place] Visit from 04/07/2021 in [place] at [facility] Visit from 01/05/2021 in [place] [facility] Total Score '4 5 5 '$ 0 4  PHQ-9 Total Score '8 9 9 '$ -- [place] from 03/29/2022 in [facility] ED from 05/30/2020 in  [facility]  C-SSRS RISK CATEGORY No Risk No Risk        Collaboration of Care: Medication Management AEB medication prescription, Primary Care Provider AEB chart review, and Other provider involved in patient's care AEB oncology notes reviewed  Patient/Guardian was advised Release of Information must be obtained prior to any record release in order to collaborate their care with an outside provider. Patient/Guardian was advised if they have not already done so to contact the registration department to sign all necessary forms in order for [place] to release information regarding their care.   Consent: Patient/Guardian gives verbal consent for treatment and assignment of benefits for services provided during this visit. Patient/Guardian expressed understanding and agreed to proceed.   Vista Mink, MD 1/9/202412:37 PM  90 minutes were spent in chart review, interview, psycho education, counseling, medical decision making, coordination of care and long-term prognosis.  Patient was given opportunity to ask question and all concerns and questions were addressed and answers.    Virtual Visit via Video Note  I connected with Norton Pastel on 03/29/22 at 10:00 AM EST by a video enabled telemedicine application and verified that I am speaking with the correct person using two identifiers.  Location: Patient: home Provider: Home office   I discussed the limitations of evaluation and management by telemedicine and the availability of in person appointments. The patient expressed understanding and agreed to proceed.   I discussed the assessment and treatment plan with the patient. The patient was provided an opportunity to ask questions and all were answered. The patient agreed with the plan and demonstrated an understanding of the instructions.   The patient was advised to call back or seek an in-person evaluation if the symptoms worsen or if the condition fails  to  improve as anticipated.  I provided 60 minutes of non-face-to-face time during this encounter.   Vista Mink, MD

## 2022-03-31 ENCOUNTER — Telehealth: Payer: Self-pay | Admitting: Cardiology

## 2022-03-31 MED ORDER — LOSARTAN POTASSIUM 25 MG PO TABS: 25.0000 mg | ORAL_TABLET | Freq: Every day | ORAL | 2 refills | Status: DC

## 2022-03-31 NOTE — Telephone Encounter (Signed)
Pt c/o BP issue: STAT if pt c/o blurred vision, one-sided weakness or slurred speech  1. What are your last 5 BP readings?   This morning: 130/95 hr84 Checked about two minutes later: 126/86 hr82   2. Are you having any other symptoms (ex. Dizziness, headache, blurred vision, passed out)? No   3. What is your BP issue? Pt states he took his propanolol at 11:30pm last night. When he woke up this morning it  felt like his heart was "squeezing" and he took his blood pressure twice. He then took another propanolol after that. Pt is concerned about the feeling he had and not sure if this is related to him starting this medication or not.

## 2022-03-31 NOTE — Telephone Encounter (Signed)
Patient agrees to decrease Losartan to '25mg'$  daily. Rx updated, no print. Instructed he can cut current tablets in half if he does not have any left over from previous Rx.   Patient also wants to know if he can that the Propanolol as a pill in a pocket like he was instructed to do when on Lopressor, when his BP is 150/100. He has been taking the 10 mg PO BID and HR has been in the 70-80's range daily.   Please advise and will send back to patient via MyChart.

## 2022-03-31 NOTE — Telephone Encounter (Signed)
Patient called to add to his previous call about the chest discomfort he has been having.  Patient states he believes it could also be related to his losartan (COZAAR) 50 MG tablet as his dosage was raised from 25 mg to 50 mg a couple of weeks ago.

## 2022-03-31 NOTE — Telephone Encounter (Signed)
Patient stated he took propranolol '10mg'$  before going to sleep last night. This morning, he felt squeezing in his chest. BP 130/95, P 84 and 20 minutes later, 126/86, 95. The squeezing "lasted a few seconds, but I still felt tightness." He denied SOB. He stated, "I have really bad anxiety. I don't know if that is causing the squeezing."  He stated he has contact PCP regarding this. "She never does anything." Pat[place] wants to know if he should still take propanolol or something else.

## 2022-04-01 ENCOUNTER — Ambulatory Visit: Payer: Medicaid Other | Attending: Cardiology | Admitting: Pharmacist

## 2022-04-01 ENCOUNTER — Encounter: Payer: Self-pay | Admitting: Pharmacist

## 2022-04-01 ENCOUNTER — Other Ambulatory Visit: Payer: Self-pay

## 2022-04-01 ENCOUNTER — Other Ambulatory Visit: Payer: Medicaid Other

## 2022-04-01 VITALS — BP 132/89 | HR 90 | Wt 215.8 lb

## 2022-04-01 DIAGNOSIS — I10 Essential (primary) hypertension: Secondary | ICD-10-CM | POA: Diagnosis not present

## 2022-04-01 DIAGNOSIS — E785 Hyperlipidemia, unspecified: Secondary | ICD-10-CM | POA: Diagnosis not present

## 2022-04-01 NOTE — Patient Instructions (Addendum)
It was nice meeting you two today  Your blood pressure readings at home are normal   Please continue your:   Losartan '25mg'$  daily Hydrochlorothiazide '25mg'$  daily Propranolol '10mg'$  twice a day  Continue to follow up with your therapist regarding your anxiety which is likely contributing to your blood pressure fluctuations  Congratulations on losing weight. I recommend increasing physical activity up to at least 30 minutes a day at least 5 days a week  Please call or message with any questions  Karren Cobble, PharmD, [place], [place], [place], [place], [place], [address] Phone: [phone], Fax: [phone]

## 2022-04-01 NOTE — Progress Notes (Signed)
Lipid panel collected

## 2022-04-01 NOTE — Progress Notes (Signed)
Patient ID: Cory Frazier                 DOB: [birth year]                      MRN: [id number]     HPI: Barton Want is a 47 y.o. male referred by Dr. Dr Harriet Masson to HTN clinic. PMH is significant for anxiety, panic disorders, and HLD.  Patient has called several times regarding blood pressure readings.  Patient presents today with girlfriend of 10 years. Patient admits his health concerns likely stem from his underlying anxiety and worry. Reports fear of death, blood pressure increases, heart attack risk, and medication side effects. Reports he does not like taking medications because he always has side effects. Does not like taking current HTN medications.  Brought blood pressure log. Checks blood pressure frequently, upwards of 10 times per day but has recently decreased to three times daily. Is not working. Does not have hobbies. Formerly played musical instruments but he reports that also gives him anxiety. Does not exercise but will frequently go outside to see what neighbors are doing because he is worried they will steal from him.  Recently saw behavioral health who recommended starting on low dose Lexapro but patient will not start due to fear of QT prolongation. Uses internet and Reddit to research medication side effects.  Sees a therapist every other week.  Takes low dose propranolol twice daily for anxiety. Recently found an old bottle of Xanax which he says is the only thing that has been helping him.  Current HTN meds:  HCTZ '25mg'$  daily Losartan '25mg'$  daily Propranolol '10mg'$  BID  Wt Readings from Last 3 Encounters:  03/17/22 219 lb 9.6 oz (99.6 kg)  03/16/22 221 lb 4.8 oz (100.4 kg)  03/03/22 225 lb (102.1 kg)   BP Readings from Last 3 Encounters:  03/17/22 124/75  03/16/22 136/85  03/03/22 (!) 122/98   Pulse Readings from Last 3 Encounters:  03/17/22 97  03/16/22 85  03/03/22 96    Renal function: CrCl cannot be calculated (Unknown ideal weight.).  Past Medical  History:  Diagnosis Date   Anxiety    Asthma     Current Outpatient Medications on File Prior to Visit  Medication Sig Dispense Refill   aspirin 81 MG chewable tablet Chew 81 mg by mouth daily. Takes rarely     atorvastatin (LIPITOR) 20 MG tablet Take 1 tablet (20 mg total) by mouth daily. 30 tablet 2   escitalopram (LEXAPRO) 5 MG tablet Take 1 tablet (5 mg total) by mouth daily. 30 tablet 1   gabapentin (NEURONTIN) 100 MG capsule Take 1 capsule (100 mg total) by mouth 3 (three) times daily as needed. 90 capsule 2   hydrochlorothiazide (HYDRODIURIL) 12.5 MG tablet Take 2 tablets (25 mg total) by mouth daily. 90 tablet 0   losartan (COZAAR) 25 MG tablet Take 1 tablet (25 mg total) by mouth daily. 90 tablet 2   propranolol (INDERAL) 20 MG tablet Take 1/2 tablet ( 10 mg ) twice a day     No current facility-administered medications on file prior to visit.    Allergies  Allergen Reactions   Codeine Other (See Comments)    unknown unknown   Claritin [Loratadine] Anxiety     Assessment/Plan:  1. Hypertension -  Patient BP in room 132/89 and patient visibly anxious in room. Advised he discontinue frequent blood pressure checks at home as they are consistently normal and  leads to him worrying more.   Discussed echo results with patient and showed results normal. Showed results of BP monitor which showed sinus rhythm.   Recommended patient continue to follow with therapy and behavioral health as he does not seem to have active cardio issues. Encouraged him to find stress relieving activities and hobbies to get mind off his own health.  Encouraged increasing physicial activity. No medication changes needed at this time.  Continue Losartan '25mg'$  daily HCTZ '25mg'$  daily Propranolol '10mg'$  BID Recheck as needed  Karren Cobble, PharmD, [place], [place], [place], [place], [place], [address] Phone: [phone], Fax: [phone]

## 2022-04-02 LAB — LIPID PANEL
Chol/HDL Ratio: 3 ratio (ref 0.0–5.0)
Cholesterol, Total: 119 mg/dL (ref 100–199)
HDL: 40 mg/dL (ref >39)
LDL Chol Calc (NIH): 57 mg/dL (ref 0–99)
Triglycerides: 120 mg/dL (ref 0–149)
VLDL Cholesterol Cal: 22 mg/dL (ref 5–40)

## 2022-04-02 LAB — SPECIMEN STATUS REPORT

## 2022-04-03 ENCOUNTER — Telehealth: Payer: Self-pay | Admitting: Home Health

## 2022-04-03 NOTE — Telephone Encounter (Signed)
Patient called after-hours line, reports that his blood pressure has been elevated 136/102, he noted the diastolic BP has been 35-521V. He recently was told to reduce losartan from 50 to 25 mg daily because he has complained chest pain as a side effect per telephone notes 03/31/2022.  He states he has a lots of anxiety, also does not like taking medication.  He is wondering if he can take losartan 25 mg twice daily for his blood pressure and not experiencing chest pain.  Given mild elevated blood pressure, advised patient okay to take losartan 25 mg twice daily starting tomorrow, advised patient to continue to record blood pressure in his log, monitor himself for tolerance, call back if there is any concern.  He likely will benefit from psychiatric medication for anxiety.  Will forward this message to Dr. Alcide Goodness. He is agreeable.

## 2022-04-06 ENCOUNTER — Telehealth: Payer: Self-pay

## 2022-04-06 ENCOUNTER — Other Ambulatory Visit: Payer: Self-pay | Admitting: Family

## 2022-04-06 DIAGNOSIS — R42 Dizziness and giddiness: Secondary | ICD-10-CM

## 2022-04-06 DIAGNOSIS — Z0100 Encounter for examination of eyes and vision without abnormal findings: Secondary | ICD-10-CM

## 2022-04-06 DIAGNOSIS — F411 Generalized anxiety disorder: Secondary | ICD-10-CM | POA: Diagnosis not present

## 2022-04-06 NOTE — Telephone Encounter (Signed)
Copied from [place] [id number]. Topic: Referral - Request for Referral >> Apr 06, 2022 10:23 AM Oley Balm A wrote: Has patient seen PCP for this complaint? No.  *If NO, is insurance requiring patient see PCP for this issue before PCP can refer them? Referral for which specialty: ENT Preferred provider/office: Pt is wanting the PCP to refer him to and ENT through [facility] Reason for referral: Breathing issues

## 2022-04-06 NOTE — Telephone Encounter (Signed)
Pt request referral to ophthalmologist and ENT for breathing issues, pt referred to ENT over year ago.in 2022

## 2022-04-06 NOTE — Telephone Encounter (Unsigned)
Copied from [place] [phone]. Topic: Referral - Request for Referral >> Apr 06, 2022 10:28 AM Oley Balm A wrote: Has patient seen PCP for this complaint? Yes.   *If NO, is insurance requiring patient see PCP for this issue before PCP can refer them? Referral for which specialty: ophthalmologist Preferred provider/office: Pt would like for the PCP to provide a [facility] ophthalmologist Reason for referral: pt states that he has already spoken with PCP regarding ophthalmologist

## 2022-04-06 NOTE — Telephone Encounter (Signed)
Orders complete.

## 2022-04-07 ENCOUNTER — Telehealth: Payer: Self-pay | Admitting: Cardiology

## 2022-04-07 NOTE — Telephone Encounter (Signed)
Spoke to patient he stated he spoke to NP on call this past weekend.He wanted to let Dr.Tobb know he changed to Losartan to 25 mg twice a day.Stated 50 mg caused him to have chest pain.B/P this morning 106/71,169/105 at present 119/89.He wanted to make sure ok to take Losartan 25 mg twice a day.He also wanted to know if ok to continue HCTZ 12.5 mg 2 tablets daily.Stated he heard it can cause elevated calcium.I will send message to Dr.Tobb for advice.

## 2022-04-07 NOTE — Telephone Encounter (Signed)
Patient calling to see if his dr seen his encounter notes from over the weekend. Patient would like for him to take a look at 1/14 to let him know what he should de. Pleases do

## 2022-04-14 ENCOUNTER — Encounter: Payer: Self-pay | Admitting: Gastroenterology

## 2022-04-20 DIAGNOSIS — F411 Generalized anxiety disorder: Secondary | ICD-10-CM | POA: Diagnosis not present

## 2022-04-29 ENCOUNTER — Other Ambulatory Visit: Payer: Self-pay | Admitting: Physician Assistant

## 2022-04-29 DIAGNOSIS — I10 Essential (primary) hypertension: Secondary | ICD-10-CM

## 2022-05-02 ENCOUNTER — Ambulatory Visit (HOSPITAL_COMMUNITY): Payer: Medicaid Other | Admitting: Psychiatry

## 2022-05-10 ENCOUNTER — Ambulatory Visit: Payer: Medicaid Other | Admitting: Gastroenterology

## 2022-05-10 ENCOUNTER — Encounter: Payer: Self-pay | Admitting: Gastroenterology

## 2022-05-10 VITALS — BP 128/92 | HR 80 | Ht 66.0 in | Wt 212.6 lb

## 2022-05-10 DIAGNOSIS — R140 Abdominal distension (gaseous): Secondary | ICD-10-CM

## 2022-05-10 DIAGNOSIS — R0789 Other chest pain: Secondary | ICD-10-CM

## 2022-05-10 DIAGNOSIS — R109 Unspecified abdominal pain: Secondary | ICD-10-CM

## 2022-05-10 DIAGNOSIS — R12 Heartburn: Secondary | ICD-10-CM

## 2022-05-10 NOTE — Patient Instructions (Signed)
_______________________________________________________  If your blood pressure at your visit was 140/90 or greater, please contact your primary care physician to follow up on this.  _______________________________________________________  If you are age 48 or older, your body mass index should be between 23-30. Your Body mass index is 34.31 kg/m. If this is out of the aforementioned range listed, please consider follow up with your Primary Care Provider.  If you are age 47 or younger, your body mass index should be between 19-25. Your Body mass index is 34.31 kg/m. If this is out of the aformentioned range listed, please consider follow up with your Primary Care Provider.   ________________________________________________________  The [facility] providers would like to encourage you to use [facility] to communicate with providers for non-urgent requests or questions.  Due to long hold times on the telephone, sending your provider a message by [facility] may be a faster and more efficient way to get a response.  Please allow 48 business hours for a response.  Please remember that this is for non-urgent requests.  _______________________________________________________  It has been recommended to you by your physician that you have a(n) EGD completed. Per your request, we did not schedule the procedure(s) today. Please contact our office at [phone] should you decide to have the procedure completed. You will be scheduled for a pre-visit and procedure at that time.   It was a pleasure to see you today!  Thank you for trusting me with your gastrointestinal care!

## 2022-05-10 NOTE — Progress Notes (Signed)
[facility] Consult Note:  History: Cory Frazier 05/10/2022  Referring provider: Camillia Herter, NP  Reason for consult/chief complaint: Bloated (Patient complains of non-cardiac chest pain and pressure, epigastric tightness after meals, chronic heartburn)   Subjective  HPI: Patient was referred by his PCP NP Durene Fruits for abdominal bloating. Recent PCP notes reveal that patient has been struggling with severe anxiety and panic attacks. He was evaluated by cardiology on 03/17/2022 for elevated blood pressures and chest pain which they suspected was associated with severe anxiety as his cardiac work-up was reassuring. He had an echo done on 03/15/2022 as noted below.  He reports abdominal bloating for many years. He reports severe epigastric tightness/pain once every 6 months or so. He states that he had had issues with reflux/heartburn since childhood. Patient was taking Pepto and Alka-seltzer for this as a child. He reports that his epigastric pressure is secondary to possible reflux. He reports taking OTC antacids and feels that this may interfere with his blood pressure medications. He also takes Pepto Bismol and OTC gas relief medications. He states that he has occasional episodes late at night where he feels severe abdominal quivering/spasms.  He took Omeprazole once in 2022 without any relief. He started having dizzy spells after that and worries that he had heart problems or problems triggered by the Omeprazole.  He denies unexpected weight loss, decreased appetite, or dysphagia. He intentionally lost ~20lbs over the course of a month or so after healthy dietary changes. He denies any constipation, diarrhea, or blood with bowel movements.   ROS: Review of Systems  Constitutional:  Negative for appetite change, fever and unexpected weight change.  HENT:  Negative for trouble swallowing.   Respiratory:  Negative for cough and shortness of breath.    Cardiovascular:  Positive for chest pain.  Gastrointestinal:  Positive for abdominal distention (bloating) and abdominal pain. Negative for anal bleeding, blood in stool, constipation, diarrhea, nausea, rectal pain and vomiting.       + gas + reflux/heartburn  Genitourinary:  Negative for dysuria.  Musculoskeletal:  Negative for back pain.  Skin:  Negative for rash.  Neurological:  Negative for weakness.  Psychiatric/Behavioral:  The patient is nervous/anxious.   All other systems reviewed and are negative.    Past Medical History: Past Medical History:  Diagnosis Date   Anxiety    Asthma    Hyperlipidemia    Obesity    Palpitations    Polycythemia    Vitamin D deficiency      Past Surgical History: History reviewed. No pertinent surgical history.   Family History: Family History  Problem Relation Age of Onset   Colon cancer Mother    Heart disease Father     Social History: Social History   Socioeconomic History   Marital status: Single    Spouse name: Not on file   Number of children: Not on file   Years of education: Not on file   Highest education level: Not on file  Occupational History   Not on file  Tobacco Use   Smoking status: Never    Passive exposure: Never   Smokeless tobacco: Never  Vaping Use   Vaping Use: Never used  Substance and Sexual Activity   Alcohol use: Yes    Comment: Occasional    Drug use: No   Sexual activity: Not Currently  Other Topics Concern   Not on file  Social History Narrative   Not on file   Social  Determinants of Health   Financial Resource Strain: Not on file  Food Insecurity: Not on file  Transportation Needs: Not on file  Physical Activity: Not on file  Stress: Not on file  Social Connections: Not on file    Allergies: Allergies  Allergen Reactions   Codeine Other (See Comments)    unknown unknown   Claritin [Loratadine] Anxiety    Outpatient Meds: Current Outpatient Medications  Medication  Sig Dispense Refill   aspirin 81 MG chewable tablet Chew 81 mg by mouth daily. Takes rarely     atorvastatin (LIPITOR) 20 MG tablet Take 1 tablet (20 mg total) by mouth daily. 30 tablet 2   hydrochlorothiazide (HYDRODIURIL) 12.5 MG tablet TAKE 2 TABLETS BY MOUTH DAILY 90 tablet 0   losartan (COZAAR) 25 MG tablet Take 1 tablet (25 mg total) by mouth daily. 90 tablet 2   propranolol (INDERAL) 20 MG tablet Take 1/2 tablet ( 10 mg ) twice a day     No current facility-administered medications for this visit.      ___________________________________________________________________ Objective   Exam:  BP (!) 136/100   Pulse 80   Ht 5' 6"$  (1.676 m)   Wt 212 lb 9.6 oz (96.4 kg)   BMI 34.31 kg/m  Wt Readings from Last 3 Encounters:  05/10/22 212 lb 9.6 oz (96.4 kg)  04/01/22 215 lb 12.8 oz (97.9 kg)  03/17/22 219 lb 9.6 oz (99.6 kg)    General: well-appearing with normal vocal quality Eyes: sclera anicteric, no redness ENT: oral mucosa moist without lesions, no cervical, axillary or supraclavicular lymphadenopathy CV: RRR, no JVD, no peripheral edema Resp: clear to auscultation bilaterally, normal RR and effort noted GI: soft, no tenderness, with active bowel sounds. No guarding or palpable organomegaly noted.  Tender over xiphoid process without physical abnormality.  No abdominal wall hernia noted.  No bruit Skin; warm and dry, no rash or jaundice noted Neuro: awake, alert and oriented x 3. Normal gross motor function and fluent speech  Labs:  Lab Results  Component Value Date   WBC 5.5 03/16/2022   HGB 17.9 (H) 03/16/2022   HCT 49.8 03/16/2022   MCV 84.7 03/16/2022   PLT 210 03/16/2022       Component Value Date/Time   NA 134 (L) 03/16/2022 1216   NA 138 02/08/2022 1531   K 3.5 03/16/2022 1216   CL 99 03/16/2022 1216   CO2 30 03/16/2022 1216   GLUCOSE 111 (H) 03/16/2022 1216   BUN 13 03/16/2022 1216   BUN 19 02/08/2022 1531   CREATININE 1.12 03/16/2022 1216    CALCIUM 9.9 03/16/2022 1216   PROT 7.1 03/16/2022 1216   PROT 7.1 02/08/2022 1531   ALBUMIN 4.5 03/16/2022 1216   ALBUMIN 4.7 02/08/2022 1531   AST 25 03/16/2022 1216   ALT 36 03/16/2022 1216   ALKPHOS 52 03/16/2022 1216   BILITOT 0.7 03/16/2022 1216   GFRNONAA >60 03/16/2022 1216    Radiologic Studies:  TTE 03/15/2022 1. Left ventricular ejection fraction, by estimation, is 55 to 60%. The  left ventricle has normal function. The left ventricle has no regional  wall motion abnormalities. Left ventricular diastolic parameters were  normal.   2. Right ventricular systolic function is normal. The right ventricular  size is normal. Tricuspid regurgitation signal is inadequate for assessing  PA pressure.   3. The mitral valve is normal in structure. Trivial mitral valve  regurgitation. No evidence of mitral stenosis.   4. The aortic valve  is tricuspid. Aortic valve regurgitation is not  visualized. No aortic stenosis is present.   5. The inferior vena cava is normal in size with greater than 50%  respiratory variability, suggesting right atrial pressure of 3 mmHg.    Assessment: Heartburn  Non-cardiac chest pain  Bloating  Abdominal wall pain   Noncardiac chest pain and multiple nonspecific upper digestive symptoms, somewhat difficult to characterize.  Suspect there may be a significant functional component given his underlying anxiety and other somatic symptoms.  Consider H. pylori, erosive or nonerosive reflux, functional heartburn.   Plan: Recommended endoscopy, he would like to consider the procedure and will contact us if he would like to proceed.  Procedure described in detail along with risks and benefits. He was somewhat anxious about the prospect of doing that, and wanted more time to consider it.  It is understandable, and he was offered the option of contacting us when and if he feels ready to proceed with the endoscopy.  No current plans for changes or additions  to his medicines.  He will need colon cancer screening colonoscopy at some point   Thank you for the courtesy of this consult.  Please call me with any questions or concerns.  Eugene Gavia  CC: Referring provider noted above   I,Alexis Herring,acting as a Education administrator for [place], MD.,have documented all relevant documentation on the behalf of Doran Stabler, MD,as directed by  Doran Stabler, MD while in the presence of Doran Stabler, MD.

## 2022-05-11 DIAGNOSIS — F411 Generalized anxiety disorder: Secondary | ICD-10-CM | POA: Diagnosis not present

## 2022-05-23 DIAGNOSIS — J301 Allergic rhinitis due to pollen: Secondary | ICD-10-CM | POA: Diagnosis not present

## 2022-05-23 DIAGNOSIS — R42 Dizziness and giddiness: Secondary | ICD-10-CM | POA: Insufficient documentation

## 2022-05-23 DIAGNOSIS — G4730 Sleep apnea, unspecified: Secondary | ICD-10-CM | POA: Insufficient documentation

## 2022-05-23 DIAGNOSIS — I10 Essential (primary) hypertension: Secondary | ICD-10-CM | POA: Insufficient documentation

## 2022-05-23 DIAGNOSIS — J342 Deviated nasal septum: Secondary | ICD-10-CM | POA: Insufficient documentation

## 2022-05-23 DIAGNOSIS — J322 Chronic ethmoidal sinusitis: Secondary | ICD-10-CM | POA: Insufficient documentation

## 2022-05-25 ENCOUNTER — Telehealth: Payer: Self-pay | Admitting: Cardiology

## 2022-05-25 NOTE — Telephone Encounter (Signed)
Patient was returning phone call. Please call back 

## 2022-05-25 NOTE — Telephone Encounter (Signed)
Patient said that he was prescribed Flonase by another doctor and want to know if it is ok to take with other medication

## 2022-05-25 NOTE — Telephone Encounter (Signed)
Yes Flonase is fine for him to use.

## 2022-05-25 NOTE — Telephone Encounter (Signed)
Called pt back. Per chart review I cannot see any additional information to provide the pt. Told pt it might have been an accident. Pt verbalizes understanding.

## 2022-05-25 NOTE — Telephone Encounter (Signed)
Returned call to patient and made him aware of PharmD's recommendations. Patient verbalized understanding.

## 2022-05-26 ENCOUNTER — Other Ambulatory Visit: Payer: Self-pay | Admitting: Family

## 2022-05-26 DIAGNOSIS — E785 Hyperlipidemia, unspecified: Secondary | ICD-10-CM

## 2022-05-26 MED ORDER — ATORVASTATIN CALCIUM 20 MG PO TABS: 20.0000 mg | ORAL_TABLET | Freq: Every day | ORAL | 2 refills | Status: DC

## 2022-05-26 NOTE — Telephone Encounter (Signed)
Medication Refill - Medication: atorvastatin (LIPITOR) 20 MG tablet [id number]   Has the patient contacted their pharmacy? Yes.   (Agent: If no, request that the patient contact the pharmacy for the refill. If patient does not wish to contact the pharmacy document the reason why and proceed with request.) (Agent: If yes, when and what did the pharmacy advise?)  Preferred Pharmacy (with phone number or street name):  [facility] [id number], [place] [address]  Phone: [phone] Fax: [phone]  Hours: Not open 24 hours   Has the patient been seen for an appointment in the last year OR does the patient have an upcoming appointment? Yes.    Agent: Please be advised that RX refills may take up to 3 business days. We ask that you follow-up with your pharmacy.

## 2022-05-26 NOTE — Telephone Encounter (Signed)
Requested Prescriptions  Pending Prescriptions Disp Refills   atorvastatin (LIPITOR) 20 MG tablet 30 tablet 2    Sig: Take 1 tablet (20 mg total) by mouth daily.     Cardiovascular:  Antilipid - Statins Failed - 05/26/2022  3:23 PM      Failed - Lipid Panel in normal range within the last 12 months    Cholesterol, Total  Date Value Ref Range Status  04/01/2022 119 100 - 199 mg/dL Final   LDL Chol Calc (NIH)  Date Value Ref Range Status  04/01/2022 57 0 - 99 mg/dL Final   HDL  Date Value Ref Range Status  04/01/2022 40 >39 mg/dL Final   Triglycerides  Date Value Ref Range Status  04/01/2022 120 0 - 149 mg/dL Final         Passed - Patient is not pregnant      Passed - Valid encounter within last 12 months    Recent Outpatient Visits           2 months ago Annual physical exam   [facility], Amy J, NP   3 months ago Primary hypertension   [facility] at [facility], Amy J, NP   8 months ago Essential (primary) hypertension   [facility], [place], NP   1 year ago Essential hypertension   [facility] at [facility], [place], NP   1 year ago Essential hypertension   [facility], Flonnie Hailstone, NP       Future Appointments             In 3 months Tobb, Godfrey Pick, DO [place] at [facility]

## 2022-06-09 ENCOUNTER — Telehealth: Payer: Self-pay | Admitting: Cardiology

## 2022-06-09 NOTE — Telephone Encounter (Signed)
*  STAT* If patient is at the pharmacy, call can be transferred to refill team.   1. Which medications need to be refilled? (please list name of each medication and dose if known) patient said he would like a new prescription for Losartan 25 mg 2 times a day- this was changed in January 2024  2. Which pharmacy/location (including street and city if local pharmacy) is medication to be sent to?[place], [place]   3. Do they need a 30 day or 90 day supply? 90 days and refills

## 2022-06-10 MED ORDER — LOSARTAN POTASSIUM 25 MG PO TABS: 25.0000 mg | ORAL_TABLET | Freq: Two times a day (BID) | ORAL | 2 refills | Status: DC

## 2022-06-10 NOTE — Telephone Encounter (Signed)
Refill for Losartan 25 bid has been sent to [facility].

## 2022-06-13 ENCOUNTER — Ambulatory Visit: Payer: Medicaid Other | Admitting: Cardiology

## 2022-06-21 ENCOUNTER — Other Ambulatory Visit: Payer: Self-pay | Admitting: Physician Assistant

## 2022-06-21 DIAGNOSIS — I10 Essential (primary) hypertension: Secondary | ICD-10-CM

## 2022-06-30 DIAGNOSIS — Z1211 Encounter for screening for malignant neoplasm of colon: Secondary | ICD-10-CM | POA: Diagnosis not present

## 2022-06-30 DIAGNOSIS — L723 Sebaceous cyst: Secondary | ICD-10-CM | POA: Diagnosis not present

## 2022-07-07 ENCOUNTER — Telehealth: Payer: Self-pay | Admitting: Cardiology

## 2022-07-07 NOTE — Telephone Encounter (Signed)
Pt c/o BP issue: STAT if pt c/o blurred vision, one-sided weakness or slurred speech  1. What are your last 5 BP readings?  04/18 - 8:00   A.M. - 108/70 04/17 - 11:45  P.M. - 161/107               After meds - 142/98  04/16 - 171/108   2. Are you having any other symptoms (ex. Dizziness, headache, blurred vision, passed out)? States he has blurred vision and dizziness all the time anyway, so he wouldn't know if it is out of normal.   3. What is your BP issue? Patient states that his BP is spiking at random times and would like to discuss this.

## 2022-07-07 NOTE — Telephone Encounter (Signed)
Patient stated this past weekend he was experiencing chest tightness, he thought the symptom was due to his " stomach was swollen and the pressure was pushing into his chest". He also felt "jerky sensation". Pt is currently asymptomatic. He is taking losartan as prescribed however, he was not taking hydrochlorothiazide as prescribed. Pt stated he is supposed to take 2 pills of   hydrochlorothiazide, however for the past two months he was taking just one. Last night patient stated he took propranolol and 0.25 xanax because he was feeling "anxious " of his blood pressure readings. Looked on the pt medication list, pt does not have an prescription for xanax . Explained the importance of taking medications as prescribed and ED precautions. Patient voiced understanding. Will forward to MD and nurse for advise.    Blood pressure readings  4/18  108/70 4/17   173/108  1209 AM 4/17    109/73    0730 4/17   161/107    2340 ( took xanax and propranolol @ 2240) 4/16    142/94    2340

## 2022-07-07 NOTE — Telephone Encounter (Signed)
Spoke to the patient, explained Dr Servando Salina recommendations: I would recommend that he goes back on his proper dosing of  antihypertensive that we had discussed and then he can give me an update in a couple weeks.    Patient voiced understanding.

## 2022-08-19 ENCOUNTER — Telehealth: Payer: Self-pay | Admitting: Cardiology

## 2022-08-19 NOTE — Telephone Encounter (Signed)
Pt c/o BP issue: STAT if pt c/o blurred vision, one-sided weakness or slurred speech  1. What are your last 5 BP readings? 117/71 - Today 150/101 - Last night 162/108 - Night before last  2. Are you having any other symptoms (ex. Dizziness, headache, blurred vision, passed out)? Headaches  3. What is your BP issue? BP has been elevated off and on for the last few days. Pt would like a callback regarding this matter as well as discuss medications. Please advise

## 2022-08-19 NOTE — Telephone Encounter (Signed)
Patient would like to see PharmD in hypertension clinic

## 2022-08-19 NOTE — Telephone Encounter (Signed)
Patient states he takes his blood pressure 1.5 hours after taking his losartan and hydrochlorothiazide. He states his blood pressure still be elevated after taking his medications. He did disclose that checking his blood pressure gives him anxiety.   Informed patient that his anxiety can also increase his blood pressure as well. did advise before he takes his blood pressure to sit still for 5-10 minutes try to relax and calm his nerves. Also advised not to check his blood pressure so frequently because this only increases his anxiety. He also stated that he does not  take his propranolol when he takes his losartan and his hydrochlorothiazide. He waits until after he waits 2 hours. Advised patient to keep log of blood pressure so we can see his trend and to also  try taking the propranolol at the same time as his losartan and hydrochlorothiazide and wait 2 hours and then check his blood pressure. Will forward to provider and pharmD being though he was seen in hypertension clinic for further recommendations.

## 2022-08-25 ENCOUNTER — Other Ambulatory Visit: Payer: Self-pay | Admitting: Family

## 2022-08-25 ENCOUNTER — Other Ambulatory Visit: Payer: Self-pay | Admitting: *Deleted

## 2022-08-25 DIAGNOSIS — E785 Hyperlipidemia, unspecified: Secondary | ICD-10-CM

## 2022-08-25 MED ORDER — ATORVASTATIN CALCIUM 20 MG PO TABS: 20.0000 mg | ORAL_TABLET | Freq: Every day | ORAL | 2 refills | Status: DC

## 2022-08-25 NOTE — Telephone Encounter (Signed)
Medication Refill - Medication: atorvastatin (LIPITOR) 20 MG tablet  Requesting 90 day supply he said he recevied before Only has 2 pills left  Has the patient contacted their pharmacy? yes (Agent: If no, request that the patient contact the pharmacy for the refill. If patient does not wish to contact the pharmacy document the reason why and proceed with request.) (Agent: If yes, when and what did the pharmacy advise?)contact pcp  Preferred Pharmacy (with phone number or street name):  [facility] [id number] [place], [place] - [address] Phone: [phone]  Fax: [phone]     Has the patient been seen for an appointment in the last year OR does the patient have an upcoming appointment? yes  Agent: Please be advised that RX refills may take up to 3 business days. We ask that you follow-up with your pharmacy.

## 2022-08-26 NOTE — Telephone Encounter (Signed)
Requested Prescriptions  Refused Prescriptions Disp Refills   atorvastatin (LIPITOR) 20 MG tablet 30 tablet 2    Sig: Take 1 tablet (20 mg total) by mouth daily.     Cardiovascular:  Antilipid - Statins Failed - 08/25/2022  3:59 PM      Failed - Lipid Panel in normal range within the last 12 months    Cholesterol, Total  Date Value Ref Range Status  04/01/2022 119 100 - 199 mg/dL Final   LDL Chol Calc (NIH)  Date Value Ref Range Status  04/01/2022 57 0 - 99 mg/dL Final   HDL  Date Value Ref Range Status  04/01/2022 40 >39 mg/dL Final   Triglycerides  Date Value Ref Range Status  04/01/2022 120 0 - 149 mg/dL Final         Passed - Patient is not pregnant      Passed - Valid encounter within last 12 months    Recent Outpatient Visits           5 months ago Annual physical exam   [facility], Amy J, NP   6 months ago Primary hypertension   [facility], Amy J, NP   11 months ago Essential (primary) hypertension   [facility], [place], NP   1 year ago Essential hypertension   [facility], [place], NP   1 year ago Essential hypertension   [place] [facility], [place], NP       Future Appointments             In 2 weeks Tobb, Lavona Mound, DO [facility]   In 3 weeks  [employer] at [facility]

## 2022-09-14 ENCOUNTER — Ambulatory Visit: Payer: Medicaid Other | Attending: Cardiology | Admitting: Cardiology

## 2022-09-14 ENCOUNTER — Encounter: Payer: Self-pay | Admitting: Cardiology

## 2022-09-14 VITALS — BP 134/84 | HR 82 | Ht 66.0 in | Wt 221.2 lb

## 2022-09-14 DIAGNOSIS — Z7689 Persons encountering health services in other specified circumstances: Secondary | ICD-10-CM | POA: Diagnosis not present

## 2022-09-14 DIAGNOSIS — I10 Essential (primary) hypertension: Secondary | ICD-10-CM | POA: Diagnosis not present

## 2022-09-14 NOTE — Patient Instructions (Addendum)
Medication Instructions:  Your physician recommends that you continue on your current medications as directed. Please refer to the Current Medication list given to you today.  *If you need a refill on your cardiac medications before your next appointment, please call your pharmacy*   Lab Work: None   Testing/Procedures: None   Follow-Up: At [facility], you and your health needs are our priority.  As part of our continuing mission to provide you with exceptional heart care, we have created designated Provider Care Teams.  These Care Teams include your primary Cardiologist (physician) and Advanced Practice Providers (APPs -  Physician Assistants and Nurse Practitioners) who all work together to provide you with the care you need, when you need it.  Your next appointment:   6 month(s)  Provider:   Kardie Tobb, DO      

## 2022-09-14 NOTE — Progress Notes (Signed)
Cardiology Office Note:    Date:  09/16/2022   ID:  Cory Frazier, DOB January 18, 1976, MRN [id number]  PCP:  Cory Fendt, NP  Cardiologist:  Thomasene Ripple, DO  Electrophysiologist:  None   Referring MD: Cory Fendt, NP   " I am ok"  History of Present Illness:    Cory Frazier is a 47 y.o. male with a hx of    He was last seen by Laural Frazier, PharmD at that time he was advised to stop taking frequent blood pressure.  He was also advised to continue his losartan 25 mg daily, hydrochlorothiazide 25 mg daily and propranolol 10 mg twice daily.  He is here today with his girlfriend for follow-up visit.  Following with behavioral health because he did not want to take medications. But he is now reconsidering this.   Past Medical History:  Diagnosis Date   Anxiety    Asthma    Hyperlipidemia    Obesity    Palpitations    Polycythemia    Vitamin D deficiency     No past surgical history on file.  Current Medications: Current Meds  Medication Sig   aspirin 81 MG chewable tablet Chew 81 mg by mouth daily. Takes rarely   atorvastatin (LIPITOR) 20 MG tablet Take 1 tablet (20 mg total) by mouth daily.   hydrochlorothiazide (HYDRODIURIL) 12.5 MG tablet TAKE 2 TABLETS BY MOUTH DAILY   losartan (COZAAR) 25 MG tablet Take 1 tablet (25 mg total) by mouth in the morning and at bedtime.   propranolol (INDERAL) 20 MG tablet Take 1/2 tablet ( 10 mg ) twice a day     Allergies:   Codeine and Claritin [loratadine]   Social History   Socioeconomic History   Marital status: Single    Spouse name: Not on file   Number of children: Not on file   Years of education: Not on file   Highest education level: Not on file  Occupational History   Not on file  Tobacco Use   Smoking status: Never    Passive exposure: Never   Smokeless tobacco: Never  Vaping Use   Vaping Use: Never used  Substance and Sexual Activity   Alcohol use: Yes    Comment: Occasional    Drug use: No   Sexual  activity: Not Currently  Other Topics Concern   Not on file  Social History Narrative   Not on file   Social Determinants of Health   Financial Resource Strain: Not on file  Food Insecurity: Not on file  Transportation Needs: Not on file  Physical Activity: Not on file  Stress: Not on file  Social Connections: Not on file     Family History: The patient's family history includes Colon cancer in his mother; Heart disease in his father.  ROS:   Review of Systems  Constitution: Negative for decreased appetite, fever and weight gain.  HENT: Negative for congestion, ear discharge, hoarse voice and sore throat.   Eyes: Negative for discharge, redness, vision loss in right eye and visual halos.  Cardiovascular: Negative for chest pain, dyspnea on exertion, leg swelling, orthopnea and palpitations.  Respiratory: Negative for cough, hemoptysis, shortness of breath and snoring.   Endocrine: Negative for heat intolerance and polyphagia.  Hematologic/Lymphatic: Negative for bleeding problem. Does not bruise/bleed easily.  Skin: Negative for flushing, nail changes, rash and suspicious lesions.  Musculoskeletal: Negative for arthritis, joint pain, muscle cramps, myalgias, neck pain and stiffness.  Gastrointestinal: Negative for  abdominal pain, bowel incontinence, diarrhea and excessive appetite.  Genitourinary: Negative for decreased libido, genital sores and incomplete emptying.  Neurological: Negative for brief paralysis, focal weakness, headaches and loss of balance.  Psychiatric/Behavioral: Negative for altered mental status, depression and suicidal ideas.  Allergic/Immunologic: Negative for HIV exposure and persistent infections.    EKGs/Labs/Other Studies Reviewed:    The following studies were reviewed today:   EKG:  None today   Recent Labs: 02/28/2022: TSH 1.470 09/16/2022: ALT 23; BUN 17; Creatinine 1.03; Hemoglobin 17.5; Platelet Count 215; Potassium 3.4; Sodium 137  Recent  Lipid Panel    Component Value Date/Time   CHOL 119 04/01/2022 0000   TRIG 120 04/01/2022 0000   HDL 40 04/01/2022 0000   CHOLHDL 3.0 04/01/2022 0000   LDLCALC 57 04/01/2022 0000    Physical Exam:    VS:  BP 134/84   Pulse 82   Ht 5\' 6"  (1.676 m)   Wt 221 lb 3.2 oz (100.3 kg)   SpO2 99%   BMI 35.70 kg/m     Wt Readings from Last 3 Encounters:  09/16/22 221 lb 1.6 oz (100.3 kg)  09/14/22 221 lb 3.2 oz (100.3 kg)  05/10/22 212 lb 9.6 oz (96.4 kg)     GEN: Well nourished, well developed in no acute distress HEENT: Normal NECK: No JVD; No carotid bruits LYMPHATICS: No lymphadenopathy CARDIAC: S1S2 noted,RRR, no murmurs, rubs, gallops RESPIRATORY:  Clear to auscultation without rales, wheezing or rhonchi  ABDOMEN: Soft, non-tender, non-distended, +bowel sounds, no guarding. EXTREMITIES: No edema, No cyanosis, no clubbing MUSCULOSKELETAL:  No deformity  SKIN: Warm and dry NEUROLOGIC:  Alert and oriented x 3, non-focal PSYCHIATRIC:  Normal affect, good insight  ASSESSMENT:    1. Essential (primary) hypertension   2. Establishing care with new doctor, encounter for    PLAN:    From a cardiovascular standpoint he is doing well.  Will keep him on his current antihypertensive medication regimen.  Continue hydrochlorothiazide 25 mg daily, losartan 25 mg daily, propranolol 20 mg twice daily.  He needs to follow-up with his behavioral health specialist.  He wants to reconsider discussion on medication regimen.  The patient is in agreement with the above plan. The patient left the office in stable condition.  The patient will follow up in   Medication Adjustments/Labs and Tests Ordered: Current medicines are reviewed at length with the patient today.  Concerns regarding medicines are outlined above.  Orders Placed This Encounter  Procedures   Ambulatory referral to [facility]   No orders of the defined types were placed in this encounter.   Patient Instructions   Medication Instructions:  Your physician recommends that you continue on your current medications as directed. Please refer to the Current Medication list given to you today.  *If you need a refill on your cardiac medications before your next appointment, please call your pharmacy*   Lab Work: None   Testing/Procedures: None   Follow-Up: At [facility], you and your health needs are our priority.  As part of our continuing mission to provide you with exceptional heart care, we have created designated Provider Care Teams.  These Care Teams include your primary Cardiologist (physician) and Advanced Practice Providers (APPs -  Physician Assistants and Nurse Practitioners) who all work together to provide you with the care you need, when you need it.   Your next appointment:   6 month(s)  Provider:   Thomasene Ripple, DO     Adopting a  Healthy Lifestyle.  Know what a healthy weight is for you (roughly BMI <25) and aim to maintain this   Aim for 7+ servings of fruits and vegetables daily   65-80+ fluid ounces of water or unsweet tea for healthy kidneys   Limit to max 1 drink of alcohol per day; avoid smoking/tobacco   Limit animal fats in diet for cholesterol and heart health - choose grass fed whenever available   Avoid highly processed foods, and foods high in saturated/trans fats   Aim for low stress - take time to unwind and care for your mental health   Aim for 150 min of moderate intensity exercise weekly for heart health, and weights twice weekly for bone health   Aim for 7-9 hours of sleep daily   When it comes to diets, agreement about the perfect plan isnt easy to find, even among the experts. Experts at the [facility] [employer] developed an idea known as the Healthy Eating Plate. Just imagine a plate divided into logical, healthy portions.   The emphasis is on diet quality:   Load up on vegetables and fruits - one-half of your plate: Aim for  color and variety, and remember that potatoes dont count.   Go for whole grains - one-quarter of your plate: Whole wheat, barley, wheat berries, quinoa, oats, brown rice, and foods made with them. If you want pasta, go with whole wheat pasta.   Protein power - one-quarter of your plate: Fish, chicken, beans, and nuts are all healthy, versatile protein sources. Limit red meat.   The diet, however, does go beyond the plate, offering a few other suggestions.   Use healthy plant oils, such as olive, canola, soy, corn, sunflower and peanut. Check the labels, and avoid partially hydrogenated oil, which have unhealthy trans fats.   If youre thirsty, drink water. Coffee and tea are good in moderation, but skip sugary drinks and limit milk and dairy products to one or two daily servings.   The type of carbohydrate in the diet is more important than the amount. Some sources of carbohydrates, such as vegetables, fruits, whole grains, and beans-are healthier than others.   Finally, stay active  Signed, Thomasene Ripple, DO  09/16/2022 8:01 PM    [facility]

## 2022-09-15 ENCOUNTER — Other Ambulatory Visit: Payer: Self-pay | Admitting: Physician Assistant

## 2022-09-15 DIAGNOSIS — D751 Secondary polycythemia: Secondary | ICD-10-CM

## 2022-09-16 ENCOUNTER — Other Ambulatory Visit: Payer: Self-pay

## 2022-09-16 ENCOUNTER — Inpatient Hospital Stay (HOSPITAL_BASED_OUTPATIENT_CLINIC_OR_DEPARTMENT_OTHER): Payer: Medicaid Other | Admitting: Physician Assistant

## 2022-09-16 ENCOUNTER — Inpatient Hospital Stay: Payer: Medicaid Other | Attending: Physician Assistant

## 2022-09-16 VITALS — BP 131/100 | HR 76 | Temp 98.1°F | Resp 18 | Ht 66.0 in | Wt 221.1 lb

## 2022-09-16 DIAGNOSIS — D751 Secondary polycythemia: Secondary | ICD-10-CM | POA: Insufficient documentation

## 2022-09-16 DIAGNOSIS — R0683 Snoring: Secondary | ICD-10-CM

## 2022-09-16 LAB — CBC WITH DIFFERENTIAL (CANCER CENTER ONLY)
Abs Immature Granulocytes: 0.02 10*3/uL (ref 0.00–0.07)
Basophils Absolute: 0.1 10*3/uL (ref 0.0–0.1)
Basophils Relative: 1 %
Eosinophils Absolute: 0.2 10*3/uL (ref 0.0–0.5)
Eosinophils Relative: 2 %
HCT: 48.8 % (ref 39.0–52.0)
Hemoglobin: 17.5 g/dL — ABNORMAL HIGH (ref 13.0–17.0)
Immature Granulocytes: 0 %
Lymphocytes Relative: 37 %
Lymphs Abs: 3 10*3/uL (ref 0.7–4.0)
MCH: 31.1 pg (ref 26.0–34.0)
MCHC: 35.9 g/dL (ref 30.0–36.0)
MCV: 86.7 fL (ref 80.0–100.0)
Monocytes Absolute: 0.7 10*3/uL (ref 0.1–1.0)
Monocytes Relative: 8 %
Neutro Abs: 4.2 10*3/uL (ref 1.7–7.7)
Neutrophils Relative %: 52 %
Platelet Count: 215 10*3/uL (ref 150–400)
RBC: 5.63 MIL/uL (ref 4.22–5.81)
RDW: 12.2 % (ref 11.5–15.5)
WBC Count: 8.1 10*3/uL (ref 4.0–10.5)
nRBC: 0 % (ref 0.0–0.2)

## 2022-09-16 LAB — CMP (CANCER CENTER ONLY)
ALT: 23 U/L (ref 0–44)
AST: 19 U/L (ref 15–41)
Albumin: 4.2 g/dL (ref 3.5–5.0)
Alkaline Phosphatase: 67 U/L (ref 38–126)
Anion gap: 6 (ref 5–15)
BUN: 17 mg/dL (ref 6–20)
CO2: 29 mmol/L (ref 22–32)
Calcium: 9.6 mg/dL (ref 8.9–10.3)
Chloride: 102 mmol/L (ref 98–111)
Creatinine: 1.03 mg/dL (ref 0.61–1.24)
GFR, Estimated: >60 mL/min (ref >60)
Glucose, Bld: 86 mg/dL (ref 70–99)
Potassium: 3.4 mmol/L — ABNORMAL LOW (ref 3.5–5.1)
Sodium: 137 mmol/L (ref 135–145)
Total Bilirubin: 0.7 mg/dL (ref 0.3–1.2)
Total Protein: 7.4 g/dL (ref 6.5–8.1)

## 2022-09-16 NOTE — Progress Notes (Signed)
[facility] [phone]   [phone]  PROGRESS NOTE  Patient Care Team: Rema Fendt, NP as PCP - General (Nurse Practitioner) Thomasene Ripple, DO as PCP - Cardiology (Cardiology)  CHIEF COMPLAINTS/PURPOSE OF CONSULTATION:  Secondary polycythemia  HISTORY OF PRESENTING ILLNESS:  Cory Frazier 47 y.o. 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   On exam today, Cory Frazier reports no changes to his health since the last visit. His appetite and energy are overall stale. He is able to complete all his daily activities on his own. He reports occasional headaches which resolve on its own. He does continue to snore regularly but has not undergone sleep study. He denies fevers, chills, sweats, shortness of breath, chest pain or cough. He has no other complaints. Rest of the 10 point ROS is below.   MEDICAL HISTORY:  Past Medical History:  Diagnosis Date   Anxiety    Asthma    Hyperlipidemia    Obesity    Palpitations    Polycythemia    Vitamin D deficiency     SURGICAL HISTORY: No past surgical history on file.  SOCIAL HISTORY: Social History   Socioeconomic History   Marital status: Single    Spouse name: Not on file   Number of children: Not on file   Years of education: Not on file   Highest education level: Not on file  Occupational History   Not on file  Tobacco Use   Smoking status: Never    Passive exposure: Never   Smokeless tobacco: Never  Vaping Use   Vaping Use: Never used  Substance and Sexual Activity   Alcohol use: Yes    Comment: Occasional    Drug use: No   Sexual activity: Not Currently  Other Topics Concern   Not on file  Social History Narrative   Not on file   Social Determinants of Health   Financial Resource Strain: Not on file  Food Insecurity: Not on file  Transportation Needs: Not on file  Physical Activity: Not on file  Stress: Not on file  Social Connections: Not on file  Intimate  Partner Violence: Not on file    FAMILY HISTORY: Family History  Problem Relation Age of Onset   Colon cancer Mother    Heart disease Father     ALLERGIES:  is allergic to codeine and claritin [loratadine].  MEDICATIONS:  Current Outpatient Medications  Medication Sig Dispense Refill   aspirin 81 MG chewable tablet Chew 81 mg by mouth daily. Takes rarely     atorvastatin (LIPITOR) 20 MG tablet Take 1 tablet (20 mg total) by mouth daily. 30 tablet 2   hydrochlorothiazide (HYDRODIURIL) 12.5 MG tablet TAKE 2 TABLETS BY MOUTH DAILY 180 tablet 3   losartan (COZAAR) 25 MG tablet Take 1 tablet (25 mg total) by mouth in the morning and at bedtime. 180 tablet 2   propranolol (INDERAL) 20 MG tablet Take 1/2 tablet ( 10 mg ) twice a day     No current facility-administered medications for this visit.    REVIEW OF SYSTEMS:   Constitutional: ( - ) fevers, ( - )  chills , ( - ) night sweats Eyes: ( - ) blurriness of vision, ( - ) double vision, ( - ) watery eyes Ears, nose, mouth, throat, and face: ( - ) mucositis, ( - ) sore throat Respiratory: ( - ) cough, ( - ) dyspnea, ( - ) wheezes Cardiovascular: ( - ) palpitation, ( - )  chest discomfort, ( - ) lower extremity swelling Gastrointestinal:  ( - ) nausea, ( - ) heartburn, ( - ) change in bowel habits Skin: ( - ) abnormal skin rashes Lymphatics: ( - ) new lymphadenopathy, ( - ) easy bruising Neurological: ( - ) numbness, ( - ) tingling, ( - ) new weaknesses Behavioral/Psych: ( - ) mood change, ( - ) new changes  All other systems were reviewed with the patient and are negative.  PHYSICAL EXAMINATION: ECOG PERFORMANCE STATUS: 1 - Symptomatic but completely ambulatory  Vitals:   09/16/22 1036  BP: (!) 131/100  Pulse: 76  Resp: 18  Temp: 98.1 F (36.7 C)  SpO2: 100%   Filed Weights   09/16/22 1036  Weight: 221 lb 1.6 oz (100.3 kg)    GENERAL: well appearing male in NAD  SKIN: skin color, texture, turgor are normal, no rashes or  significant lesions EYES: conjunctiva are pink and non-injected, sclera clear LUNGS: clear to auscultation and percussion with normal breathing effort HEART: regular rate & rhythm and no murmurs and no lower extremity edema Musculoskeletal: no cyanosis of digits and no clubbing  PSYCH: alert & oriented x 3, fluent speech NEURO: no focal motor/sensory deficits  LABORATORY DATA:  I have reviewed the data as listed    Latest Ref Rng & Units 09/16/2022   10:10 AM 03/16/2022   12:16 PM 02/28/2022   10:43 AM  CBC  WBC 4.0 - 10.5 K/uL 8.1  5.5  8.6   Hemoglobin 13.0 - 17.0 g/dL 16.1  09.6  04.5   Hematocrit 39.0 - 52.0 % 48.8  49.8  57.3   Platelets 150 - 400 K/uL 215  210  263        Latest Ref Rng & Units 09/16/2022   10:10 AM 03/16/2022   12:16 PM 02/08/2022    3:31 PM  CMP  Glucose 70 - 99 mg/dL 86  409  82   BUN 6 - 20 mg/dL 17  13  19    Creatinine 0.61 - 1.24 mg/dL 8.11  9.14  7.82   Sodium 135 - 145 mmol/L 137  134  138   Potassium 3.5 - 5.1 mmol/L 3.4  3.5  4.3   Chloride 98 - 111 mmol/L 102  99  99   CO2 22 - 32 mmol/L 29  30  24    Calcium 8.9 - 10.3 mg/dL 9.6  9.9  95.6   Total Protein 6.5 - 8.1 g/dL 7.4  7.1  7.1   Total Bilirubin 0.3 - 1.2 mg/dL 0.7  0.7  0.5   Alkaline Phos 38 - 126 U/L 67  52  61   AST 15 - 41 U/L 19  25  21    ALT 0 - 44 U/L 23  36  21    RADIOGRAPHIC STUDIES: I have personally reviewed the radiological images as listed and agreed with the findings in the report. No results found.  ASSESSMENT & PLAN Cory Frazier is a 47 y.o. male who returns for a follow up for polycythemia. 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  --patient is a non smoker and does not use any testosterone containing products --patient has symptoms concerning for sleep apnea, but was not able to complete a sleep study as it wasn't approved by his insurance at  the time. We will refer patient to neurology to see if they are  able to assist with testing.  --labs today show mild improvement with Hgb of 17.5, Hct 48.8%.  --RTC in 6 months or sooner if indicated by the above labs.    No orders of the defined types were placed in this encounter.   All questions were answered. The patient knows to call the clinic with any problems, questions or concerns.  I have spent a total of 25 minutes minutes of face-to-face and non-face-to-face time, preparing to see the patient, performing a medically appropriate examination, counseling and educating the patient, referring and communicating with other health care professionals, documenting clinical information in the electronic health record,  and care coordination.   Georga Kaufmann, PA-C Department of Hematology/Oncology [facility] at [facility] Phone: [phone]

## 2022-09-19 ENCOUNTER — Telehealth: Payer: Self-pay | Admitting: Cardiology

## 2022-09-19 NOTE — Telephone Encounter (Signed)
Calling to see if the appt for tomorrow is still needed. Please advise

## 2022-09-19 NOTE — Telephone Encounter (Signed)
Patient is aware he does not need appointment for tomorrow with pharmacist. Appointment canceled

## 2022-09-19 NOTE — Telephone Encounter (Signed)
Patient would like to know if he needs to keep his appointment with pharmD tomorrow being though he had appointment with provider on June 26th and was told to keep on same medications

## 2022-09-19 NOTE — Telephone Encounter (Signed)
No patient does not need to follow up with PharmD team. Recommend following up with his behavioral healtth team

## 2022-09-20 ENCOUNTER — Ambulatory Visit: Payer: Medicaid Other

## 2022-10-03 ENCOUNTER — Telehealth: Payer: Self-pay | Admitting: Cardiology

## 2022-10-03 NOTE — Telephone Encounter (Signed)
Pt c/o BP issue: STAT if pt c/o blurred vision, one-sided weakness or slurred speech  1. What are your last 5 BP readings?  135/88 131/85 145/93 140/90 171/106  2. Are you having any other symptoms (ex. Dizziness, headache, blurred vision, passed out)? No   3. What is your BP issue? Patient states these readings are typically taken after taking BP medication. Requesting return call to discuss.

## 2022-10-03 NOTE — Telephone Encounter (Signed)
Patient is requesting update on this and would like a return call.

## 2022-10-03 NOTE — Telephone Encounter (Signed)
Patient called back and stated that he thinks losartan his not a good choice for him and wanted to know what you think of lisinopril?

## 2022-10-03 NOTE — Telephone Encounter (Signed)
Patient states he feels his blood pressure has been elevated in the evenings and he does not feel like his blood pressure meds are working. He states even after medications his his readings are high. He feel like he needs adjustment.  135/88 131/85 145/93 140/90 171/106  151/93 157/97 139/89 161/83

## 2022-10-04 NOTE — Telephone Encounter (Signed)
Spoke with patient and he will see hypertension clinic

## 2022-10-10 ENCOUNTER — Telehealth: Payer: Self-pay

## 2022-10-10 ENCOUNTER — Ambulatory Visit: Payer: Self-pay

## 2022-10-10 NOTE — Telephone Encounter (Signed)
T/C from pt stating he has been having pain in his lower left leg stating it is so severe it almost brings him to tears.  Pt advised to call his PCP or go to the ED to be evaluated for a possible DVT.  Pt agreed to this plan.

## 2022-10-10 NOTE — Telephone Encounter (Signed)
Reason for Disposition  [1] MODERATE pain (e.g., interferes with normal activities, limping) AND [2] present > 3 days  Answer Assessment - Initial Assessment Questions 1. ONSET: "When did the pain start?"      1 month ago  2. LOCATION: "Where is the pain located?"      Left calf  3. PAIN: "How bad is the pain?"    (Scale 1-10; or mild, moderate, severe)   -  MILD (1-3): doesn't interfere with normal activities    -  MODERATE (4-7): interferes with normal activities (e.g., work or school) or awakens from sleep, limping    -  SEVERE (8-10): excruciating pain, unable to do any normal activities, unable to walk     Off and on/sharp pain/ comes and goes/mild to severe 4. WORK OR EXERCISE: "Has there been any recent work or exercise that involved this part of the body?"      walking 5. CAUSE: "What do you think is causing the leg pain?"     Shin splints/DVT 6. OTHER SYMPTOMS: "Do you have any other symptoms?" (e.g., chest pain, back pain, breathing difficulty, swelling, rash, fever, numbness, weakness)     When moves leg forward to front/ insect bites  Protocols used: Leg Pain-A-AH

## 2022-10-11 ENCOUNTER — Encounter (HOSPITAL_BASED_OUTPATIENT_CLINIC_OR_DEPARTMENT_OTHER): Payer: Self-pay | Admitting: Family Medicine

## 2022-10-11 ENCOUNTER — Ambulatory Visit (HOSPITAL_BASED_OUTPATIENT_CLINIC_OR_DEPARTMENT_OTHER): Payer: Medicaid Other | Admitting: Family Medicine

## 2022-10-11 ENCOUNTER — Ambulatory Visit (INDEPENDENT_AMBULATORY_CARE_PROVIDER_SITE_OTHER): Payer: Medicaid Other

## 2022-10-11 VITALS — BP 129/88 | HR 85 | Ht 66.0 in | Wt 217.0 lb

## 2022-10-11 DIAGNOSIS — E785 Hyperlipidemia, unspecified: Secondary | ICD-10-CM

## 2022-10-11 DIAGNOSIS — E559 Vitamin D deficiency, unspecified: Secondary | ICD-10-CM

## 2022-10-11 DIAGNOSIS — D751 Secondary polycythemia: Secondary | ICD-10-CM | POA: Insufficient documentation

## 2022-10-11 DIAGNOSIS — M79605 Pain in left leg: Secondary | ICD-10-CM | POA: Diagnosis not present

## 2022-10-11 DIAGNOSIS — Z1211 Encounter for screening for malignant neoplasm of colon: Secondary | ICD-10-CM

## 2022-10-11 DIAGNOSIS — Z7689 Persons encountering health services in other specified circumstances: Secondary | ICD-10-CM | POA: Diagnosis not present

## 2022-10-11 DIAGNOSIS — M79662 Pain in left lower leg: Secondary | ICD-10-CM | POA: Diagnosis not present

## 2022-10-11 NOTE — Progress Notes (Signed)
New Patient Office Visit  Subjective:   Cory Frazier [birth year] 10/11/2022  Chief Complaint  Patient presents with   Leg Pain    Ongoing for 3 weeks, left leg, denies swelling numbess/tingling    HPI: Cory Frazier presents today to establish care at [facility] at [facility]. Introduced to Publishing rights manager role and practice setting.  All questions answered.   Last PCP: Ricky Stabs, NP Last annual physical: 02/28/2022 Concerns: See below   Leg Pain Duration: weeks - 3 weeks. Patient reports change from more sedentary and light exercise related lifestyle  to moderate exercise with walking /prolonged standing on concrete frequently and now swimming 3x a week.   Involved Leg: left  Mechanism of injury:  No injury. Pain began after swimming about 3 weeks ago. Reports previous fracture to left leg at 47yo.  Location:  Anterior surface of left leg Onset: gradual  Severity: 1/10  Quality: burning, pulling, and sore Frequency: intermittent Radiation: no Aggravating factors: movement   Alleviating factors: rest  Status: fluctuating Treatments attempted: none   Relief with NSAIDs?: No NSAIDs Taken Weakness with weight bearing: no Weakness with walking: no Paresthesias / decreased sensation: no Swelling: no Redness:no Fevers: no   HYPERLIPIDEMIA: Cory Frazier presents for the medical management of hyperlipidemia.  Patient's current HLD regimen is: Lipitor 20mg  nightly  Patient is  currently taking prescribed medications for HLD.  Adhering to heathy diet: No  Exercising regularly: yes, not regularly  Denies myalgias.  Lab Results  Component Value Date   CHOL 119 04/01/2022   HDL 40 04/01/2022   LDLCALC 57 04/01/2022   TRIG 120 04/01/2022   CHOLHDL 3.0 04/01/2022    VITAMIN D DEFICIENCY: Cory Frazier presents for the medical management of Vitamin D deficiency. Patient has not had level checked since November 2023.   Current regimen: None Denies recent falls or injury.  Last vitamin D Lab Results  Component Value Date   VD25OH 34.9 02/08/2022    Health Maintenance reviewed - Patient agreeable to cscope referral for colon cancer screening.   The following portions of the patient's history were reviewed and updated as appropriate: past medical history, past surgical history, family history, social history, allergies, medications, and problem list.   Patient Active Problem List   Diagnosis Date Noted   Polycythemia 10/11/2022   Pain of left anterior lower extremity 10/11/2022   Sebaceous cyst 06/30/2022   Chronic ethmoidal sinusitis 05/23/2022   Deviated nasal septum 05/23/2022   Hypertension 05/23/2022   Orthostatic dizziness 05/23/2022   Sleep-disordered breathing 05/23/2022   GAD (generalized anxiety disorder) 03/29/2022   Panic anxiety syndrome 03/29/2022   Hyperlipidemia 03/01/2022   Vitamin D deficiency 06/30/2020   Past Medical History:  Diagnosis Date   Anxiety    Asthma    Hyperlipidemia    Obesity    Palpitations    Polycythemia    Vitamin D deficiency    History reviewed. No pertinent surgical history. Family History  Problem Relation Age of Onset   Colon cancer Mother    Heart disease Father    Social History   Socioeconomic History   Marital status: Single    Spouse name: Not on file   Number of children: Not on file   Years of education: Not on file   Highest education level: Not on file  Occupational History   Not on file  Tobacco Use   Smoking status: Never    Passive exposure: Never  Smokeless tobacco: Never  Vaping Use   Vaping status: Never Used  Substance and Sexual Activity   Alcohol use: Yes    Comment: Occasional    Drug use: No   Sexual activity: Not Currently  Other Topics Concern   Not on file  Social History Narrative   Not on file   Social Determinants of Health   Financial Resource Strain: Not on file  Food Insecurity: Not on  file  Transportation Needs: Not on file  Physical Activity: Not on file  Stress: Not on file  Social Connections: Unknown (06/23/2022)   Received from [facility], [facility]   Social Network    Social Network: Not on file  Intimate Partner Violence: Unknown (06/23/2022)   Received from [facility], [facility]   HITS    Physically Hurt: Not on file    Insult or Talk Down To: Not on file    Threaten Physical Harm: Not on file    Scream or Curse: Not on file   Outpatient Medications Prior to Visit  Medication Sig Dispense Refill   aspirin 81 MG chewable tablet Chew 81 mg by mouth daily. Takes rarely     atorvastatin (LIPITOR) 20 MG tablet Take 1 tablet (20 mg total) by mouth daily. 30 tablet 2   hydrochlorothiazide (HYDRODIURIL) 12.5 MG tablet TAKE 2 TABLETS BY MOUTH DAILY 180 tablet 3   losartan (COZAAR) 25 MG tablet Take 1 tablet (25 mg total) by mouth in the morning and at bedtime. 180 tablet 2   propranolol (INDERAL) 20 MG tablet Take 1/2 tablet ( 10 mg ) twice a day     No facility-administered medications prior to visit.   Allergies  Allergen Reactions   Codeine Other (See Comments)    unknown unknown   Claritin [Loratadine] Anxiety    ROS: A complete ROS was performed with pertinent positives/negatives noted in the HPI. The remainder of the ROS are negative.   Objective:   Today's Vitals   10/11/22 1032 10/11/22 1105  BP: (!) 139/103 129/88  Pulse: 85   SpO2: 99%   Weight: 217 lb (98.4 kg)   Height: 5\' 6"  (1.676 m)     GENERAL: Well-appearing, in NAD. Well nourished.  SKIN: Pink, warm and dry. No rash, lesion, ulceration, or ecchymoses.  Head: Normocephalic. NECK: Trachea midline. Full ROM w/o pain or tenderness. No lymphadenopathy.  EARS: Tympanic membranes are intact, translucent without bulging and without drainage. Appropriate landmarks visualized.  EYES: Conjunctiva clear without exudates. EOMI, PERRL, no drainage present.  NOSE: Septum midline w/o  deformity. Nares patent, mucosa pink and non-inflamed w/o drainage. No sinus tenderness.  THROAT: Uvula midline. Oropharynx clear. Tonsils non-inflamed without exudate. Mucous membranes pink and moist.  RESPIRATORY: Chest wall symmetrical. Respirations even and non-labored.  MSK: Muscle tone and strength appropriate for age. Joints w/o tenderness, redness, or swelling. Mild tenderness to anterior surface of lower left leg on mid tibial surface. No warmth, redness, or crepitus. Full ROM present without pain. Pain worsens with weight bearing.  EXTREMITIES: Without clubbing, cyanosis, or edema.  NEUROLOGIC: No motor or sensory deficits. Steady, even gait. C2-C12 intact.  PSYCH/MENTAL STATUS: Alert, oriented x 3. Cooperative, appropriate mood and affect.    Health Maintenance Due  Topic Date Due   DTaP/Tdap/Td (1 - Tdap) Never done   Colonoscopy  Never done         Assessment & Plan:  1. Encounter to establish care with new doctor Due to AE in December.  Patient followed with recent CBC and CMP by Oncology. Labs reviewed by PCP.    2. Pain of left anterior lower extremity Likely shin splint due to recent increase in activity and weight bearing exercise. Will rule out fracture with Tib/Fib Xray today. Recommend using ice/heat, rest, compression and NSAIDs or Tylenol for pain relief.   - DG Tibia/Fibula Left; Future  3. Hyperlipidemia, unspecified hyperlipidemia type Will evaluate HLD control with current statin with lab today. LFTs within normal limits from June 2024.  - Lipid Profile  4. Vitamin D deficiency Evaluate vitamin d deficiency with lab today and will recommend replacement if needed.   - VITAMIN D 25 Hydroxy (Vit-D Deficiency, Fractures)  5. Screening for colon cancer Referral placed for cscope to GI.  - Ambulatory referral to Gastroenterology   Patient to reach out to office if new, worrisome, or unresolved symptoms arise or if no improvement in patient's condition.  Patient verbalized understanding and is agreeable to treatment plan. All questions answered to patient's satisfaction.    Return in about 5 months (around 03/13/2023) for ANNUAL PHYSICAL (fasting labs prior) .   Of note, portions of this note may have been created with voice recognition software Physicist, medical). While this note has been edited for accuracy, occasional wrong-word or 'sound-a-like' substitutions may have occurred due to the inherent limitations of voice recognition software.  Yolanda Manges, FNP

## 2022-10-11 NOTE — Patient Instructions (Signed)
Please go to the 2nd floor for your Xray today. We will call you with the results.   Please get your lab work drawn today.    Use Ice, Heat, Rest, and compression (such as ace wrap) for your pain. Notify your provider if pain worsens, swelling, warmth or redness occurs.

## 2022-10-13 LAB — LIPID PANEL
Chol/HDL Ratio: 3.5 ratio (ref 0.0–5.0)
Cholesterol, Total: 145 mg/dL (ref 100–199)
LDL Chol Calc (NIH): 73 mg/dL (ref 0–99)
Triglycerides: 187 mg/dL — ABNORMAL HIGH (ref 0–149)
VLDL Cholesterol Cal: 31 mg/dL (ref 5–40)

## 2022-10-13 LAB — VITAMIN D 25 HYDROXY (VIT D DEFICIENCY, FRACTURES)

## 2022-10-17 ENCOUNTER — Telehealth (HOSPITAL_BASED_OUTPATIENT_CLINIC_OR_DEPARTMENT_OTHER): Payer: Self-pay | Admitting: Family Medicine

## 2022-10-17 NOTE — Telephone Encounter (Signed)
Pt is calling in regards to his bloodwork,   Please call

## 2022-10-17 NOTE — Progress Notes (Signed)
Please let pt know (if my chart message not read) that his Lipid profile looks great! His total cholesterol and bad cholesterol (LDL) are excellent. His triglycerides are slightly elevated and could be improved with increase in Omega 3 rich foods or an Omega 3 Fish Oil supplement in his diet. I am awaiting his xray and likely should result tomorrow as it will be 1 week. I will send a note with results when available.

## 2022-10-17 NOTE — Progress Notes (Signed)
Please let Cordelle know that xray is normal. This may be related to a shin splint. I would recommend NSAIDs, ice or heat, rest and stretching.

## 2022-10-20 ENCOUNTER — Encounter (HOSPITAL_BASED_OUTPATIENT_CLINIC_OR_DEPARTMENT_OTHER): Payer: Self-pay | Admitting: Family Medicine

## 2022-10-26 IMAGING — DX DG CHEST 1V PORT
1 series · 1 of 1 positions shown · non-contrast
Comparison: 07/29/2011

CLINICAL DATA: Chest pain

EXAM:
PORTABLE CHEST 1 VIEW

[chest ap]
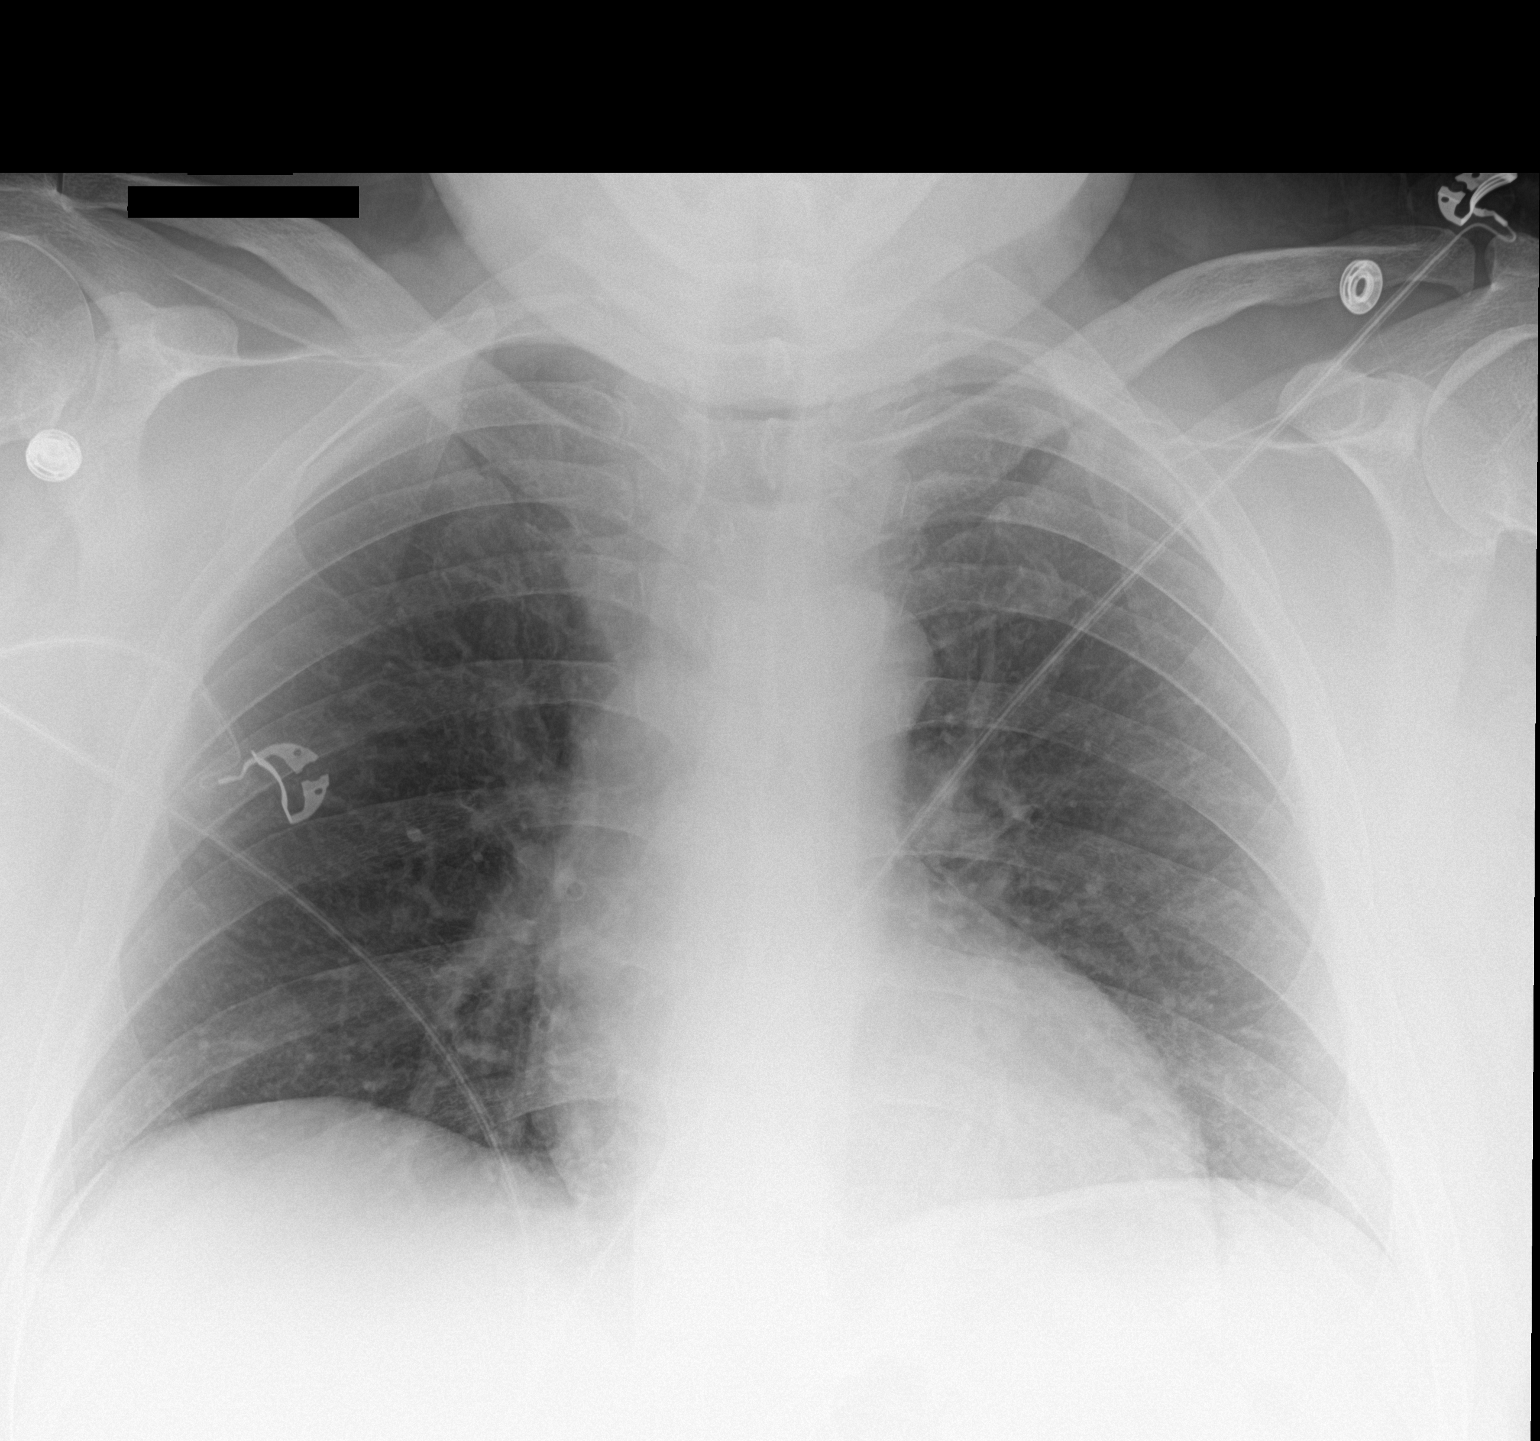

[1 of 1 positions shown; findings below may reference images not displayed]

FINDINGS: The heart size and mediastinal contours are within normal limits.
Mildly prominent perihilar and bibasilar interstitial markings. No
focal airspace consolidation. No pleural effusion or pneumothorax.
The visualized skeletal structures are unremarkable.
IMPRESSION: Mildly prominent perihilar and bibasilar interstitial markings,
nonspecific, but could reflect bronchitic type lung changes versus
mild edema or atypical/viral infection. No focal airspace
consolidation.

## 2022-10-26 IMAGING — CT CT HEAD W/O CM
2 of 3 series · 14 of 47 positions shown, 17 images · non-contrast
Comparison: None.

CLINICAL DATA: Dizziness, hypertension

EXAM:
CT HEAD WITHOUT CONTRAST
TECHNIQUE: Contiguous axial images were obtained from the base of the skull
through the vertex without intravenous contrast.

[Series 2: head wo · axial · 0.50mm/px · z∈[-77,+53]mm · 11 of 32 slices shown, 14 images]
[im 3/32  brain]
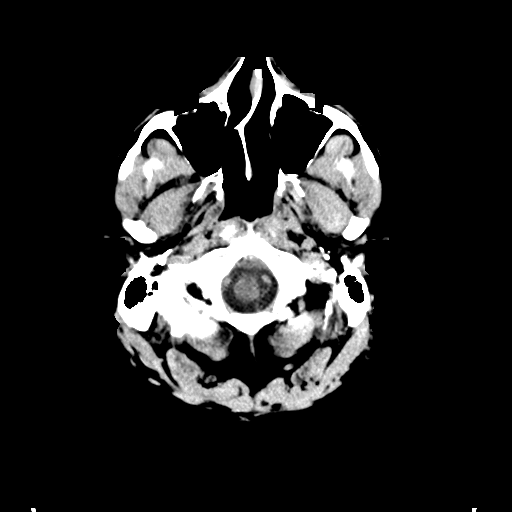
[im 3/32  bone]
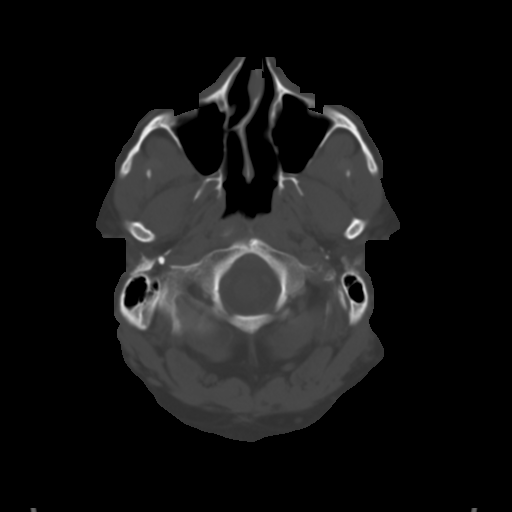
[im 5/32  brain]
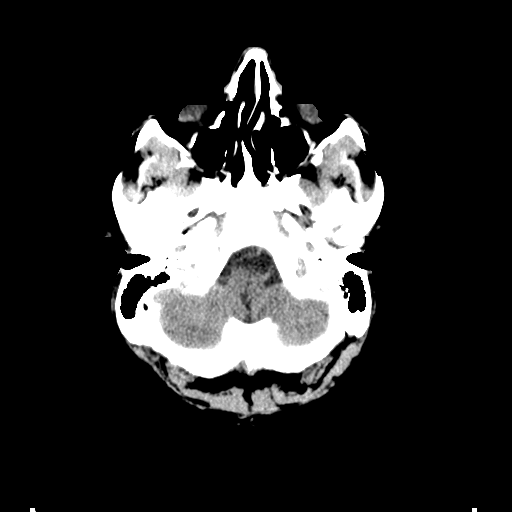
[im 8/32  brain]
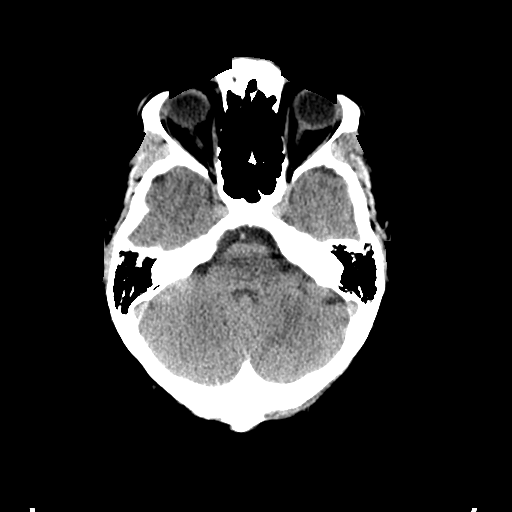
[im 10/32  brain]
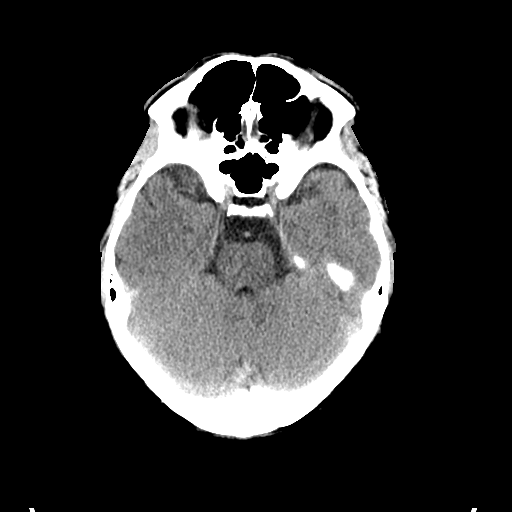
[im 13/32  brain]
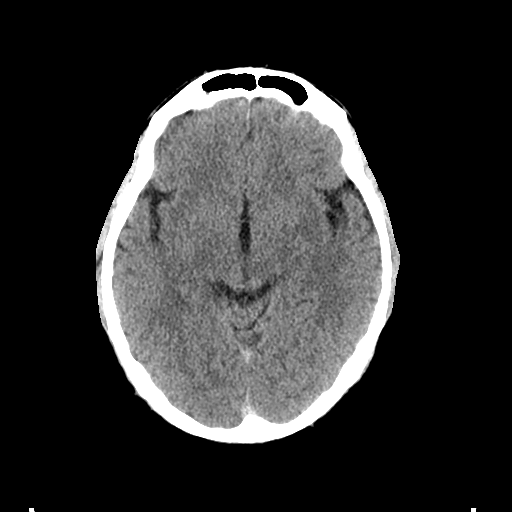
[im 13/32  bone]
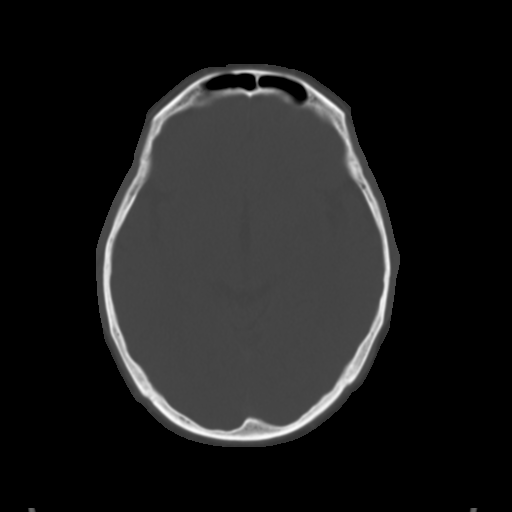
[im 17/32  brain]
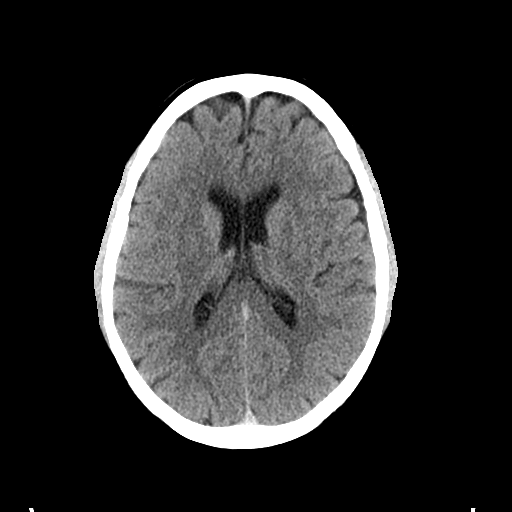
[im 19/32  brain]
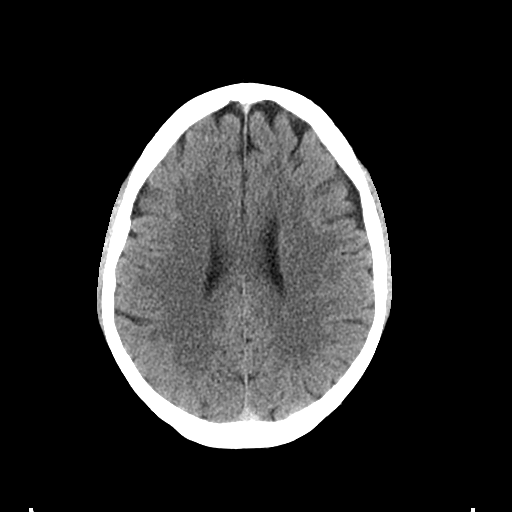
[im 22/32  brain]
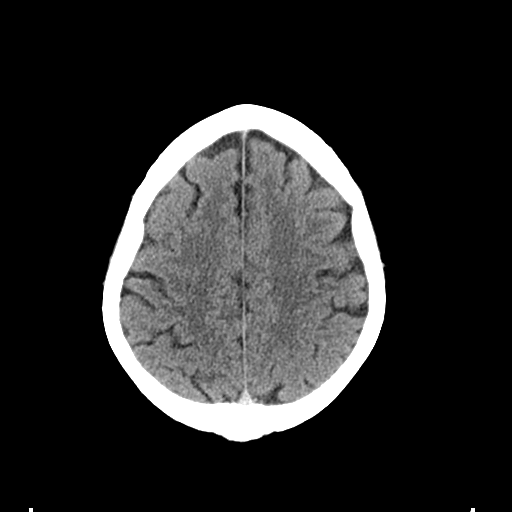
[im 24/32  brain]
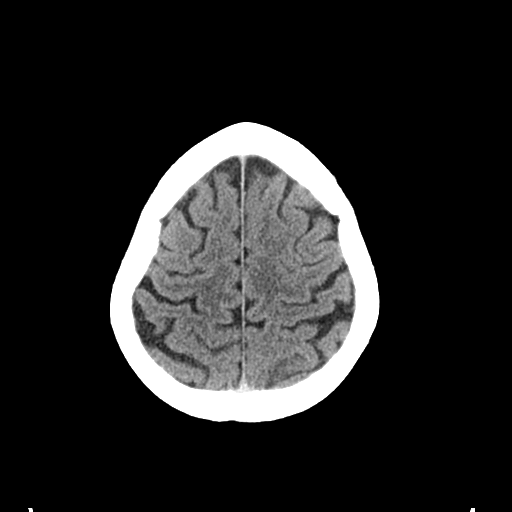
[im 24/32  bone]
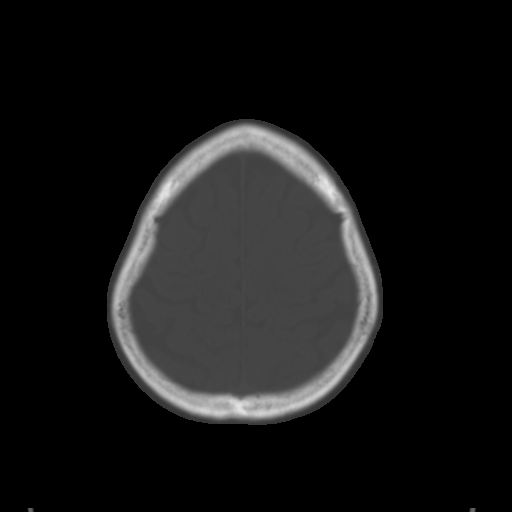
[im 27/32  brain]
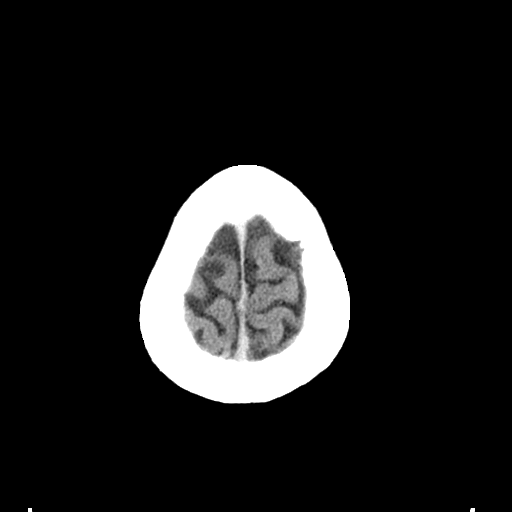
[im 29/32  brain]
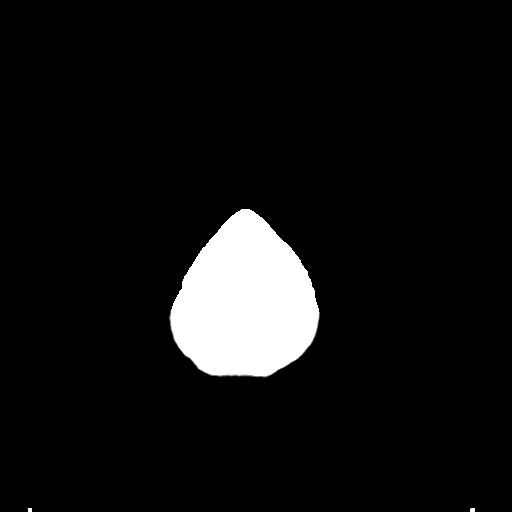

[Series 5: coronal soft tissue · coronal · 0.31mm/px · 3 of 73 slices shown]
[im 25/73  brain]
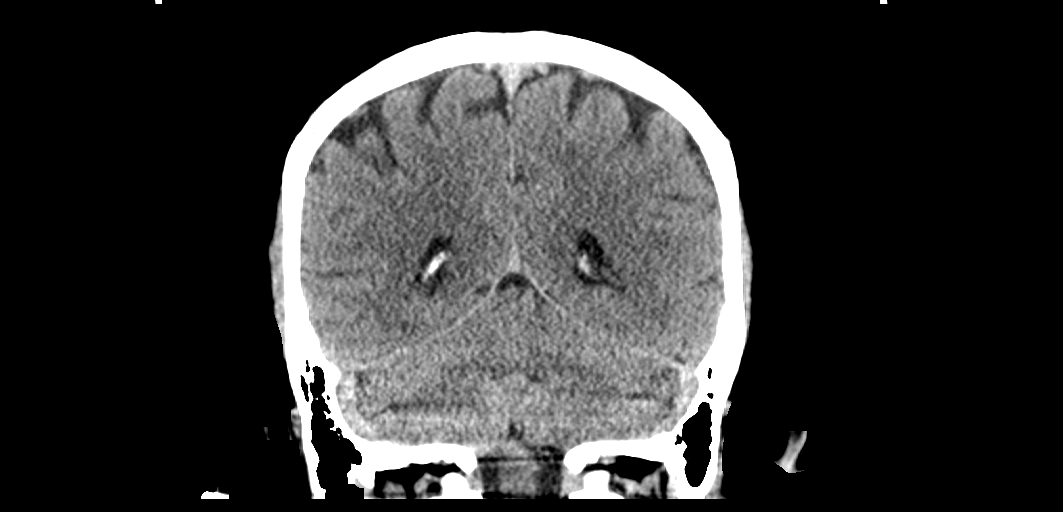
[im 33/73  brain]
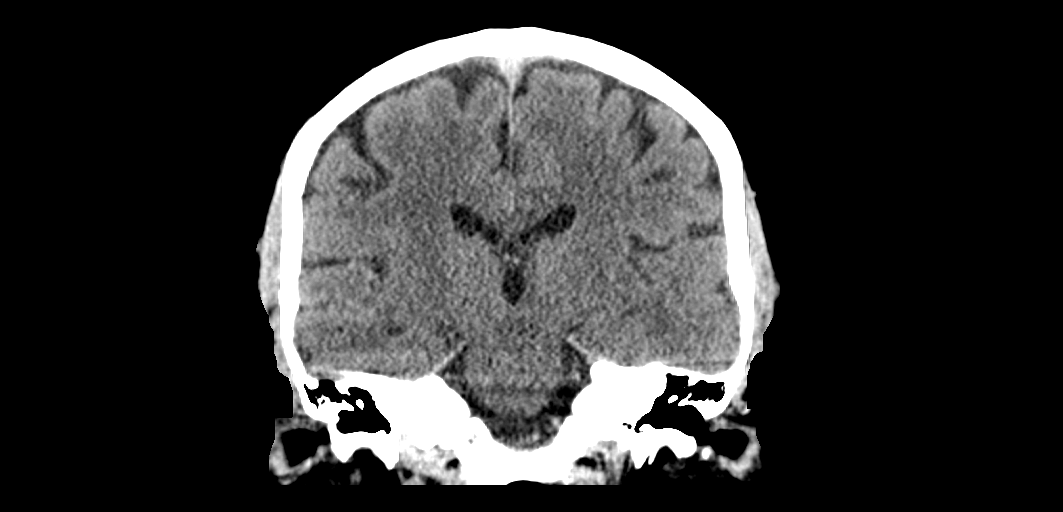
[im 41/73  brain]
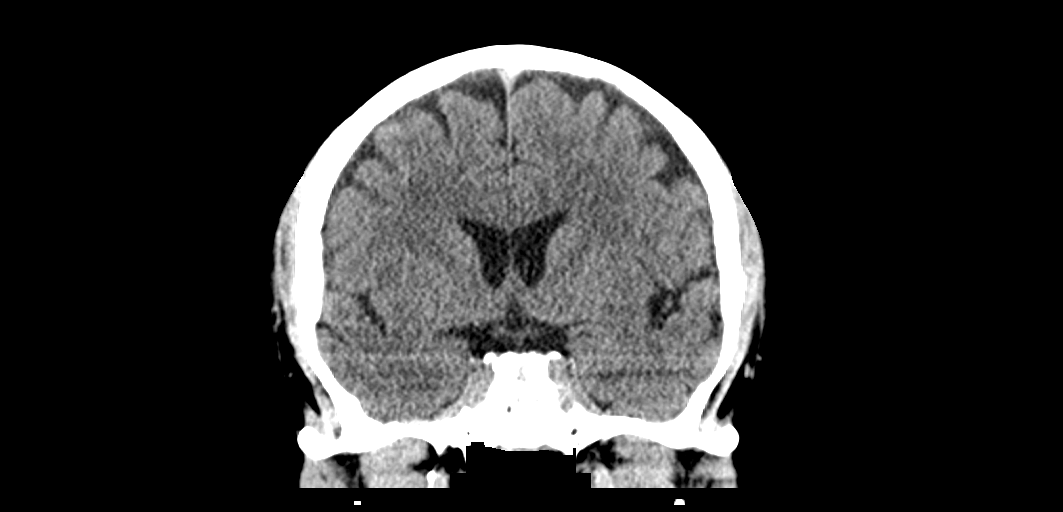

[14 of 47 positions shown; findings below may reference images not displayed]

FINDINGS: Brain: No evidence of acute infarction, hemorrhage, hydrocephalus,
extra-axial collection or mass lesion/mass effect.

Vascular: No hyperdense vessel or unexpected calcification.

Skull: Normal. Negative for fracture or focal lesion.

Sinuses/Orbits: No acute finding.

Other: None.
IMPRESSION: No acute intracranial pathology. No non-contrast CT findings to
explain dizziness.

## 2022-10-27 ENCOUNTER — Ambulatory Visit: Payer: Medicaid Other

## 2022-10-27 NOTE — Progress Notes (Deleted)
   Office Visit    Patient Name: Cory Frazier Date of Encounter: 10/27/2022  Primary Care Provider:  Hilbert Bible, FNP Primary Cardiologist:  Thomasene Ripple, DO  Chief Complaint    Hypertension  Significant Past Medical History                    Allergies  Allergen Reactions   Codeine Other (See Comments)    unknown unknown   Claritin [Loratadine] Anxiety    History of Present Illness    Cory Frazier is a 47 y.o. male patient of Dr ***,  Blood Pressure Goal:  130/80  Current Medications:    Previously tried:    Family Hx:     Social Hx:      Tobacco:  Alcohol:  Caffeine: Diet:      Exercise:   Home BP readings:      Adherence Assessment  Do you ever forget to take your medication? [] Yes [] No  Do you ever skip doses due to side effects? [] Yes [] No  Do you have trouble affording your medicines? [] Yes [] No  Are you ever unable to pick up your medication due to transportation difficulties? [] Yes [] No  Do you ever stop taking your medications because you don't believe they are helping? [] Yes [] No  Do you check your weight daily? [] Yes [] No   Adherence strategy: ***  Barriers to obtaining medications: ***     Accessory Clinical Findings    Lab Results  Component Value Date   CREATININE 1.03 09/16/2022   BUN 17 09/16/2022   NA 137 09/16/2022   K 3.4 (L) 09/16/2022   CL 102 09/16/2022   CO2 29 09/16/2022   Lab Results  Component Value Date   ALT 23 09/16/2022   AST 19 09/16/2022   ALKPHOS 67 09/16/2022   BILITOT 0.7 09/16/2022   Lab Results  Component Value Date   HGBA1C 5.3 02/28/2022    Home Medications    Current Outpatient Medications  Medication Sig Dispense Refill   aspirin 81 MG chewable tablet Chew 81 mg by mouth daily. Takes rarely     atorvastatin (LIPITOR) 20 MG tablet Take 1 tablet (20 mg total) by mouth daily. 30 tablet 2   hydrochlorothiazide (HYDRODIURIL) 12.5 MG tablet TAKE 2 TABLETS BY MOUTH DAILY  180 tablet 3   losartan (COZAAR) 25 MG tablet Take 1 tablet (25 mg total) by mouth in the morning and at bedtime. 180 tablet 2   propranolol (INDERAL) 20 MG tablet Take 1/2 tablet ( 10 mg ) twice a day     No current facility-administered medications for this visit.     No BP recorded.  {Refresh Note OR Click here to enter BP  :1}***   Assessment & Plan    No problem-specific Assessment & Plan notes found for this encounter.   Phillips Hay PharmD CPP [facility]  [address] [place], [place] [address] [phone]

## 2022-11-02 ENCOUNTER — Ambulatory Visit: Payer: Medicaid Other

## 2022-11-14 ENCOUNTER — Other Ambulatory Visit (HOSPITAL_BASED_OUTPATIENT_CLINIC_OR_DEPARTMENT_OTHER): Payer: Self-pay | Admitting: *Deleted

## 2022-11-14 DIAGNOSIS — E785 Hyperlipidemia, unspecified: Secondary | ICD-10-CM

## 2022-11-14 MED ORDER — ATORVASTATIN CALCIUM 20 MG PO TABS: 20.0000 mg | ORAL_TABLET | Freq: Every day | ORAL | 2 refills | Status: DC

## 2022-11-15 ENCOUNTER — Ambulatory Visit: Payer: Medicaid Other

## 2022-11-17 ENCOUNTER — Ambulatory Visit
Payer: Medicaid Other | Attending: Cardiovascular Disease | Admitting: Pharmacist Clinician (PhC)/ Clinical Pharmacy Specialist

## 2022-11-17 ENCOUNTER — Encounter: Payer: Self-pay | Admitting: Pharmacist Clinician (PhC)/ Clinical Pharmacy Specialist

## 2022-11-17 VITALS — BP 127/87

## 2022-11-17 DIAGNOSIS — I10 Essential (primary) hypertension: Secondary | ICD-10-CM

## 2022-11-17 MED ORDER — VALSARTAN 80 MG PO TABS: 80.0000 mg | ORAL_TABLET | Freq: Every day | ORAL | 6 refills | Status: DC

## 2022-11-17 NOTE — Progress Notes (Signed)
Office Visit    Patient Name: Cory Frazier Date of Encounter: 11/17/2022  Primary Care Provider:  Hilbert Bible, FNP Primary Cardiologist:  Thomasene Ripple, DO  Chief Complaint    Hypertension  Significant Past Medical History   HLD 7/24 LDL 73 on atorvastatin 20 mg  anxiety Currently no meds, did previously note alprazolam was helpful, was hesitant to try escitalopram    Allergies  Allergen Reactions   Codeine Other (See Comments)    unknown unknown   Claritin [Loratadine] Anxiety    History of Present Illness    Cory Frazier is a 47 y.o. male patient of Dr Servando Salina, in the office today for hypertension evaluation.  He saw Dr. Servando Salina in June, at which time his pressure was 134/84.  She made no medication changes.  He called into the office a few weeks later, concerned about some elevated home readings.  He was asked to continue with his medications and scheduled for pharmacist follow up.   He is in the office today with his girlfriend.  States he has been doing okay, admits that he gets some anxiety with taking his blood pressure and worrying about the results.  Most of the home readings look good, although close to half of the diastolic readings are > 80.  (See below)   He was given a prescription for excitalopram, however never started it, as he read it can cause QT prolongation, and he was worried about developing heart disease.    Blood Pressure Goal:  130/80  Current Medications:  losartan 25 mg every day, hcz 25 mg every day, (propranolol 10 mg bid)  Family Hx:   mother deceased - colon cancer; father living - heart disease  Social Hx:      Tobacco: no  Alcohol: occasional  Diet:   more home cooked, but does eat out some; no breakfast, usually eats just 1 meal per day, sometimes around lunch, sometimes dinner.  Admits his weakness is ice cream, and one quart will last about 3 days on average.   Home BP readings:  last 28 days - daily readings AM average 119/79   HR 72  (range  92-146/68-97) PM average 125/80  HR 78  (range 109-138/36-92)   Accessory Clinical Findings    Lab Results  Component Value Date   CREATININE 1.03 09/16/2022   BUN 17 09/16/2022   NA 137 09/16/2022   K 3.4 (L) 09/16/2022   CL 102 09/16/2022   CO2 29 09/16/2022   Lab Results  Component Value Date   ALT 23 09/16/2022   AST 19 09/16/2022   ALKPHOS 67 09/16/2022   BILITOT 0.7 09/16/2022   Lab Results  Component Value Date   HGBA1C 5.3 02/28/2022    Home Medications    Current Outpatient Medications  Medication Sig Dispense Refill   aspirin 81 MG chewable tablet Chew 81 mg by mouth daily. Takes rarely     atorvastatin (LIPITOR) 20 MG tablet Take 1 tablet (20 mg total) by mouth daily. 30 tablet 2   hydrochlorothiazide (HYDRODIURIL) 12.5 MG tablet TAKE 2 TABLETS BY MOUTH DAILY 180 tablet 3   propranolol (INDERAL) 20 MG tablet Take 1/2 tablet ( 10 mg ) twice a day     valsartan (DIOVAN) 80 MG tablet Take 1 tablet (80 mg total) by mouth daily. 30 tablet 6   No current facility-administered medications for this visit.        Assessment & Plan    Hypertension Assessment: BP  is uncontrolled in office BP 133/94 mmHg in left arm, similar in right; above the goal (<130/80). Diastolic hypertension Tolerates losartan and hctz well without any side effects Denies SOB, palpitation, chest pain, headaches,or swelling Has some anxiety around medications and blood pressure in general - spent a significant amount of the visit discussing how to deal with this Reiterated the importance of regular exercise and low salt diet   Plan:  Stop taking losartan Start taking valsartan 80 mg once daily Continue taking HCTZ 12.5 mg bid Patient to keep record of BP readings with heart rate and report to [place] at the next visit Patient to follow up with me in 1 month  Labs ordered today:  none   Phillips Hay PharmD CPP [facility]  [address] [place], [place] [address] [phone]

## 2022-11-17 NOTE — Assessment & Plan Note (Signed)
Assessment: BP is uncontrolled in office BP 133/94 mmHg in left arm, similar in right; above the goal (<130/80). Diastolic hypertension Tolerates losartan and hctz well without any side effects Denies SOB, palpitation, chest pain, headaches,or swelling Has some anxiety around medications and blood pressure in general - spent a significant amount of the visit discussing how to deal with this Reiterated the importance of regular exercise and low salt diet   Plan:  Stop taking losartan Start taking valsartan 80 mg once daily Continue taking HCTZ 12.5 mg bid Patient to keep record of BP readings with heart rate and report to [place] at the next visit Patient to follow up with me in 1 month  Labs ordered today:  none

## 2022-11-17 NOTE — Patient Instructions (Signed)
Follow up appointment: Tuesday October 1 at 9 am  Take your BP meds as follows:  Stop losartan  Start Valsartan 80 mg once daily   Check your blood pressure at home no more than twice daily and keep record of the readings.  Hypertension "High blood pressure"  Hypertension is often called "The Silent Killer." It rarely causes symptoms until it is extremely  high or has done damage to other organs in the body. For this reason, you should have your  blood pressure checked regularly by your physician. We will check your blood pressure  every time you see a provider at one of our offices.   Your blood pressure reading consists of two numbers. Ideally, blood pressure should be  below 120/80. The first ("top") number is called the systolic pressure. It measures the  pressure in your arteries as your heart beats. The second ("bottom") number is called the diastolic pressure. It measures the pressure in your arteries as the heart relaxes between beats.  The benefits of getting your blood pressure under control are enormous. A 10-point  reduction in systolic blood pressure can reduce your risk of stroke by 27% and heart failure by 28%  Your blood pressure goal is < 130/80  To check your pressure at home you will need to:  1. Sit up in a chair, with feet flat on the floor and back supported. Do not cross your ankles or legs. 2. Rest your left arm so that the cuff is about heart level. If the cuff goes on your upper arm,  then just relax the arm on the table, arm of the chair or your lap. If you have a wrist cuff, we  suggest relaxing your wrist against your chest (think of it as Pledging the Flag with the  wrong arm).  3. Place the cuff snugly around your arm, about 1 inch above the crook of your elbow. The  cords should be inside the groove of your elbow.  4. Sit quietly, with the cuff in place, for about 5 minutes. After that 5 minutes press the power  button to start a reading. 5. Do  not talk or move while the reading is taking place.  6. Record your readings on a sheet of paper. Although most cuffs have a memory, it is often  easier to see a pattern developing when the numbers are all in front of you.  7. You can repeat the reading after 1-3 minutes if it is recommended  Make sure your bladder is empty and you have not had caffeine or tobacco within the last 30 min  Always bring your blood pressure log with you to your appointments. If you have not brought your monitor in to be double checked for accuracy, please bring it to your next appointment.  You can find a list of quality blood pressure cuffs at validatebp.org

## 2022-11-22 ENCOUNTER — Other Ambulatory Visit: Payer: Self-pay

## 2022-11-22 DIAGNOSIS — E785 Hyperlipidemia, unspecified: Secondary | ICD-10-CM

## 2022-11-22 MED ORDER — ATORVASTATIN CALCIUM 20 MG PO TABS: 20.0000 mg | ORAL_TABLET | Freq: Every day | ORAL | 2 refills | Status: DC

## 2022-12-14 ENCOUNTER — Telehealth: Payer: Self-pay | Admitting: Pharmacist Clinician (PhC)/ Clinical Pharmacy Specialist

## 2022-12-14 NOTE — Telephone Encounter (Signed)
Started valsartan on 17th.

## 2022-12-14 NOTE — Telephone Encounter (Signed)
Patient called this morning and this afternoon about talking with pharmacist regarding his medication

## 2022-12-14 NOTE — Telephone Encounter (Signed)
Pt states that they want to speak with Belenda Cruise regarding his medication. Please advise

## 2022-12-16 NOTE — Telephone Encounter (Signed)
Started valsartan on the 17th.  Home BP readings still not much improved.  Will increase to 80 mg twice daily.  Patient has some issues with anxiety, assured him that 160 mg/day is a normal dose and often the starting dose for hypertensive patients.   Answered all questions

## 2022-12-20 ENCOUNTER — Encounter (HOSPITAL_COMMUNITY): Payer: Self-pay

## 2022-12-20 ENCOUNTER — Other Ambulatory Visit (HOSPITAL_COMMUNITY): Payer: Self-pay

## 2022-12-20 ENCOUNTER — Ambulatory Visit: Payer: Medicaid Other | Attending: Cardiology | Admitting: Pharmacist Clinician (PhC)/ Clinical Pharmacy Specialist

## 2022-12-20 VITALS — BP 129/90 | HR 72

## 2022-12-20 DIAGNOSIS — E559 Vitamin D deficiency, unspecified: Secondary | ICD-10-CM | POA: Diagnosis not present

## 2022-12-20 DIAGNOSIS — I10 Essential (primary) hypertension: Secondary | ICD-10-CM | POA: Diagnosis not present

## 2022-12-20 MED ORDER — HYDROCHLOROTHIAZIDE 12.5 MG PO CAPS: 12.5000 mg | ORAL_CAPSULE | Freq: Every day | ORAL | 3 refills | Status: DC

## 2022-12-20 MED ORDER — VALSARTAN 80 MG PO TABS
80.0000 mg | ORAL_TABLET | Freq: Two times a day (BID) | ORAL | 3 refills | Status: DC
  Filled 2022-12-20: qty 180, 90d supply, fill #0

## 2022-12-20 NOTE — Assessment & Plan Note (Signed)
Assessment: BP is uncontrolled in office BP 129/90 mmHg;  above the goal (<130/80). Home readings still show some elevated diastolic readings.  Tolerates current medications well without any side effects Denies SOB, palpitation, chest pain, headaches,or swelling Has only been on increased dose of valsartan (80 mg bid) Reiterated the importance of regular exercise and low salt diet   Plan:  Decrease hydrochlorothiazide to 12.5 mg daily Continue taking valsartan 80 mg twice daily Patient to keep record of BP readings with heart rate and report to [place] at the next visit Patient to follow up with PharmD in 6 weeks Labs ordered today:  BMET - 2 weeks

## 2022-12-20 NOTE — Progress Notes (Unsigned)
Office Visit    Patient Name: Cory Frazier Date of Encounter: 12/20/2022  Primary Care Provider:  Hilbert Bible, FNP Primary Cardiologist:  Thomasene Ripple, DO  Chief Complaint    Hypertension  Significant Past Medical History   HLD 7/24 LDL 73 on atorvastatin 20 mg  anxiety Currently no meds, did previously note alprazolam was helpful, was hesitant to try escitalopram    Allergies  Allergen Reactions   Codeine Other (See Comments)    unknown unknown   Claritin [Loratadine] Anxiety    History of Present Illness    Cory Frazier is a 47 y.o. male patient of Dr Servando Salina, in the office today for hypertension evaluation.  He saw Dr. Servando Salina in June, at which time his pressure was 134/84.  She made no medication changes.  He called into the office a few weeks later, concerned about some elevated home readings.  He was asked to continue with his medications and scheduled for pharmacist follow up.   When I saw him in August we switched the losartan to valsartan 80 mg daily and last week increased to 80 mg bid, as home reading were still elevated.  He had some concerns about the higher dose, but was willing to try.    He is in the office today for follow up.  Has been on the increased dose of valsartan for just a week.  Has become better at not checking pressure multiple times every day and worrying less about it.    Blood Pressure Goal:  130/80  Current Medications:  valsartan 80 mg bid, hcz 25 mg every day, (propranolol 10 mg bid)  Family Hx:   mother deceased - colon cancer; father living - heart disease  Social Hx:      Tobacco: no  Alcohol: occasional  Diet:   more home cooked, but does eat out some; no breakfast, usually eats just 1 meal per day, sometimes around lunch, sometimes dinner.  Admits his weakness is ice cream, and one quart will last about 3 days on average.   Home BP readings:  last 28 days - daily readings AM average  124/83  (previous avg 119/79)    PM  average  125/81  (previous avg 125/80)  Accessory Clinical Findings    Lab Results  Component Value Date   CREATININE 1.03 09/16/2022   BUN 17 09/16/2022   NA 137 09/16/2022   K 3.4 (L) 09/16/2022   CL 102 09/16/2022   CO2 29 09/16/2022   Lab Results  Component Value Date   ALT 23 09/16/2022   AST 19 09/16/2022   ALKPHOS 67 09/16/2022   BILITOT 0.7 09/16/2022   Lab Results  Component Value Date   HGBA1C 5.3 02/28/2022    Home Medications    Current Outpatient Medications  Medication Sig Dispense Refill   hydrochlorothiazide (MICROZIDE) 12.5 MG capsule Take 1 capsule (12.5 mg total) by mouth daily. 90 capsule 3   valsartan (DIOVAN) 80 MG tablet Take 1 tablet (80 mg total) by mouth 2 (two) times daily. 180 tablet 3   aspirin 81 MG chewable tablet Chew 81 mg by mouth daily. Takes rarely     atorvastatin (LIPITOR) 20 MG tablet Take 1 tablet (20 mg total) by mouth daily. 30 tablet 2   propranolol (INDERAL) 20 MG tablet Take 1/2 tablet ( 10 mg ) twice a day     No current facility-administered medications for this visit.     Assessment & Plan  Hypertension Assessment: BP is uncontrolled in office BP 129/90 mmHg;  above the goal (<130/80). Home readings still show some elevated diastolic readings.  Tolerates current medications well without any side effects Denies SOB, palpitation, chest pain, headaches,or swelling Has only been on increased dose of valsartan (80 mg bid) Reiterated the importance of regular exercise and low salt diet   Plan:  Decrease hydrochlorothiazide to 12.5 mg daily Continue taking valsartan 80 mg twice daily Patient to keep record of BP readings with heart rate and report to [place] at the next visit Patient to follow up with PharmD in 6 weeks Labs ordered today:  BMET - 2 weeks   Phillips Hay PharmD CPP [facility]  [address] [place], [place] [address] [phone]

## 2022-12-20 NOTE — Patient Instructions (Signed)
Follow up appointment: Tuesday Nov 12 at 9 am  Go to the lab in 2 weeks to check potassium and kidney function  Take your BP meds as follows:  Cut HCTZ back to just 12.5 mg( 1 tablet) once daily in the morning  Continue with valsartan 80 mg twice daily  Check your blood pressure at home daily (if able) and keep record of the readings.  Hypertension "High blood pressure"  Hypertension is often called "The Silent Killer." It rarely causes symptoms until it is extremely  high or has done damage to other organs in the body. For this reason, you should have your  blood pressure checked regularly by your physician. We will check your blood pressure  every time you see a provider at one of our offices.   Your blood pressure reading consists of two numbers. Ideally, blood pressure should be  below 120/80. The first ("top") number is called the systolic pressure. It measures the  pressure in your arteries as your heart beats. The second ("bottom") number is called the diastolic pressure. It measures the pressure in your arteries as the heart relaxes between beats.  The benefits of getting your blood pressure under control are enormous. A 10-point  reduction in systolic blood pressure can reduce your risk of stroke by 27% and heart failure by 28%  Your blood pressure goal is < 130/80  To check your pressure at home you will need to:  1. Sit up in a chair, with feet flat on the floor and back supported. Do not cross your ankles or legs. 2. Rest your left arm so that the cuff is about heart level. If the cuff goes on your upper arm,  then just relax the arm on the table, arm of the chair or your lap. If you have a wrist cuff, we  suggest relaxing your wrist against your chest (think of it as Pledging the Flag with the  wrong arm).  3. Place the cuff snugly around your arm, about 1 inch above the crook of your elbow. The  cords should be inside the groove of your elbow.  4. Sit quietly,  with the cuff in place, for about 5 minutes. After that 5 minutes press the power  button to start a reading. 5. Do not talk or move while the reading is taking place.  6. Record your readings on a sheet of paper. Although most cuffs have a memory, it is often  easier to see a pattern developing when the numbers are all in front of you.  7. You can repeat the reading after 1-3 minutes if it is recommended  Make sure your bladder is empty and you have not had caffeine or tobacco within the last 30 min  Always bring your blood pressure log with you to your appointments. If you have not brought your monitor in to be double checked for accuracy, please bring it to your next appointment.  You can find a list of quality blood pressure cuffs at validatebp.org

## 2022-12-21 ENCOUNTER — Other Ambulatory Visit (HOSPITAL_COMMUNITY): Payer: Self-pay

## 2022-12-22 NOTE — Telephone Encounter (Signed)
Patient states he spoke with pharmacist on the 1st and said he was to get a call back regarding "male issues" and a prescription that Dr Servando Salina  had to approve

## 2022-12-22 NOTE — Telephone Encounter (Signed)
Patient called to follow-up with Dr. Servando Salina on medication notes from visit with pharmacist.

## 2022-12-26 ENCOUNTER — Encounter (HOSPITAL_BASED_OUTPATIENT_CLINIC_OR_DEPARTMENT_OTHER): Payer: Self-pay | Admitting: Family Medicine

## 2022-12-26 NOTE — Telephone Encounter (Signed)
LMOM that he will need to speak with PCP about this type of medication

## 2022-12-26 NOTE — Telephone Encounter (Signed)
Spoke with patient, he voiced understanding and will reach out to PCP about viagra/cialis

## 2022-12-27 NOTE — Telephone Encounter (Signed)
Please see recent mychart message as well as other messages in the mychart feed and advise.  Pt is aware that Jon Gills is out of the office until 10/28 and that other providers are covering her box while she is out.

## 2022-12-29 MED ORDER — TADALAFIL 10 MG PO TABS: 10.0000 mg | ORAL_TABLET | ORAL | 1 refills | Status: AC | PRN

## 2023-01-11 ENCOUNTER — Other Ambulatory Visit: Payer: Self-pay | Admitting: Pharmacist Clinician (PhC)/ Clinical Pharmacy Specialist

## 2023-01-11 ENCOUNTER — Telehealth: Payer: Self-pay | Admitting: Cardiology

## 2023-01-11 MED ORDER — HYDROCHLOROTHIAZIDE 12.5 MG PO CAPS: 12.5000 mg | ORAL_CAPSULE | Freq: Two times a day (BID) | ORAL | 1 refills | Status: DC

## 2023-01-11 NOTE — Telephone Encounter (Signed)
error 

## 2023-01-11 NOTE — Telephone Encounter (Signed)
Pt c/o medication issue:  1. Name of Medication:   valsartan (DIOVAN) 80 MG tablet  hydrochlorothiazide (MICROZIDE) 12.5 MG capsule   2. How are you currently taking this medication (dosage and times per day)?   As prescribed  3. Are you having a reaction (difficulty breathing--STAT)?   No  4. What is your medication issue?   Patient stated his blood pressure is still high and he wants to go back on losartan.

## 2023-01-11 NOTE — Telephone Encounter (Signed)
Returned call to patient - has seen readings increase, not sure if related to moving hctz to once daily or switch from losartan to valsartan.  Will start by increasing HCTZ to bid again.  Pt to call in 2 weeks to report readings

## 2023-01-18 ENCOUNTER — Encounter: Payer: Self-pay | Admitting: Pharmacist Clinician (PhC)/ Clinical Pharmacy Specialist

## 2023-01-20 ENCOUNTER — Other Ambulatory Visit (HOSPITAL_COMMUNITY): Payer: Self-pay

## 2023-01-31 ENCOUNTER — Ambulatory Visit: Payer: Medicaid Other

## 2023-02-22 DIAGNOSIS — J342 Deviated nasal septum: Secondary | ICD-10-CM | POA: Diagnosis not present

## 2023-02-22 DIAGNOSIS — J322 Chronic ethmoidal sinusitis: Secondary | ICD-10-CM | POA: Diagnosis not present

## 2023-02-22 DIAGNOSIS — K219 Gastro-esophageal reflux disease without esophagitis: Secondary | ICD-10-CM | POA: Insufficient documentation

## 2023-02-22 DIAGNOSIS — J301 Allergic rhinitis due to pollen: Secondary | ICD-10-CM | POA: Diagnosis not present

## 2023-02-28 ENCOUNTER — Other Ambulatory Visit: Payer: Self-pay | Admitting: *Deleted

## 2023-02-28 DIAGNOSIS — D751 Secondary polycythemia: Secondary | ICD-10-CM

## 2023-03-01 ENCOUNTER — Inpatient Hospital Stay: Payer: Medicaid Other | Admitting: Hematology and Oncology

## 2023-03-01 ENCOUNTER — Ambulatory Visit: Payer: Medicaid Other

## 2023-03-01 ENCOUNTER — Inpatient Hospital Stay: Payer: Medicaid Other

## 2023-03-03 ENCOUNTER — Other Ambulatory Visit (HOSPITAL_BASED_OUTPATIENT_CLINIC_OR_DEPARTMENT_OTHER): Payer: Self-pay

## 2023-03-03 ENCOUNTER — Encounter (HOSPITAL_BASED_OUTPATIENT_CLINIC_OR_DEPARTMENT_OTHER): Payer: Self-pay | Admitting: Family Medicine

## 2023-03-03 DIAGNOSIS — E559 Vitamin D deficiency, unspecified: Secondary | ICD-10-CM

## 2023-03-06 ENCOUNTER — Other Ambulatory Visit (HOSPITAL_BASED_OUTPATIENT_CLINIC_OR_DEPARTMENT_OTHER): Payer: Medicaid Other

## 2023-03-10 ENCOUNTER — Telehealth: Payer: Self-pay | Admitting: *Deleted

## 2023-03-10 ENCOUNTER — Ambulatory Visit: Payer: Medicaid Other | Admitting: Cardiology

## 2023-03-10 NOTE — Telephone Encounter (Signed)
Received call from pt asking if he could move his next appt on 03/16/23 to after January 14th 2025. He states he has been sick and would prefer to wait until after 04/04/23 Advised that I would send a message to scheduling so they can set up a new date/time for his appts.  Pt voiced understanding.

## 2023-03-13 ENCOUNTER — Encounter (HOSPITAL_BASED_OUTPATIENT_CLINIC_OR_DEPARTMENT_OTHER): Payer: Medicaid Other | Admitting: Family Medicine

## 2023-03-13 ENCOUNTER — Telehealth: Payer: Self-pay | Admitting: Hematology and Oncology

## 2023-03-16 ENCOUNTER — Inpatient Hospital Stay: Payer: Medicaid Other | Admitting: Hematology and Oncology

## 2023-03-16 ENCOUNTER — Inpatient Hospital Stay: Payer: Medicaid Other

## 2023-04-03 ENCOUNTER — Telehealth: Payer: Self-pay | Admitting: Pharmacist Clinician (PhC)/ Clinical Pharmacy Specialist

## 2023-04-03 NOTE — Telephone Encounter (Signed)
 Patient is calling to talk with Pharmacist to see if appt on 1/22 is necessary or not

## 2023-04-03 NOTE — Telephone Encounter (Signed)
 [place], Cristal Deer, [facility]  You6 hours ago (10:56 AM)    Yes, recommend he keep appt    Patient active in [employer] message sent

## 2023-04-04 ENCOUNTER — Other Ambulatory Visit (HOSPITAL_BASED_OUTPATIENT_CLINIC_OR_DEPARTMENT_OTHER): Payer: Medicaid Other

## 2023-04-11 ENCOUNTER — Encounter (HOSPITAL_BASED_OUTPATIENT_CLINIC_OR_DEPARTMENT_OTHER): Payer: Medicaid Other | Admitting: Family Medicine

## 2023-04-12 ENCOUNTER — Encounter: Payer: Self-pay | Admitting: Pharmacist Clinician (PhC)/ Clinical Pharmacy Specialist

## 2023-04-12 ENCOUNTER — Ambulatory Visit: Payer: Medicaid Other

## 2023-04-20 ENCOUNTER — Encounter: Payer: Self-pay | Admitting: Cardiology

## 2023-04-20 ENCOUNTER — Ambulatory Visit: Payer: Medicaid Other | Attending: Cardiology | Admitting: Cardiology

## 2023-04-20 VITALS — BP 146/88 | HR 82 | Ht 67.0 in | Wt 232.0 lb

## 2023-04-20 DIAGNOSIS — E782 Mixed hyperlipidemia: Secondary | ICD-10-CM

## 2023-04-20 DIAGNOSIS — I10 Essential (primary) hypertension: Secondary | ICD-10-CM

## 2023-04-20 DIAGNOSIS — E559 Vitamin D deficiency, unspecified: Secondary | ICD-10-CM | POA: Diagnosis not present

## 2023-04-20 LAB — LIPID PANEL
Chol/HDL Ratio: 3.5 {ratio} (ref 0.0–5.0)
Cholesterol, Total: 181 mg/dL (ref 100–199)
HDL: 51 mg/dL (ref >39)
LDL Chol Calc (NIH): 102 mg/dL — ABNORMAL HIGH (ref 0–99)
Triglycerides: 162 mg/dL — ABNORMAL HIGH (ref 0–149)
VLDL Cholesterol Cal: 28 mg/dL (ref 5–40)

## 2023-04-20 LAB — VITAMIN D 25 HYDROXY (VIT D DEFICIENCY, FRACTURES): Vit D, 25-Hydroxy: 29.7 ng/mL — ABNORMAL LOW (ref 30.0–100.0)

## 2023-04-20 NOTE — Progress Notes (Signed)
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 Discuss with patient their interest in participating in the free health coaching program and refer to REF 2201/Care Navigation.

## 2023-04-20 NOTE — Progress Notes (Signed)
Cardiology Office Note:    Date:  04/20/2023   ID:  Cory Frazier, DOB 17-Dec-1975, MRN [id number]  PCP:  Hilbert Bible, FNP  Cardiologist:  Thomasene Ripple, DO  Electrophysiologist:  None   Referring MD: Rema Fendt, NP   " I am ok"  History of Present Illness:    Cory Frazier is a 48 y.o. male with a hx of hypertension, morbid obesity, vitamin D deficiency, anxiety and depression.  He is here today with his girlfriend for follow-up visit.    Today his biggest concern grossly blood pressure does not stay fix.  But he tells me he is doing well.  He anxiety has improved.  The patient reports that the readings vary, with a high of 140/85 mmHg in the morning, but generally lower readings throughout the day. The patient also reports experiencing intermittent severe leg pain, described as a stabbing sensation, which she rates as 10/10 at its worst. The pain comes and goes, lasting for a few days before disappearing for weeks. The patient also mentions a significant weight gain of approximately 20 pounds since September due to poor dietary habits. The patient has a history of anxiety, which has improved over the past year, but still experiences anxiety during medical appointments.    Past Medical History:  Diagnosis Date   Anxiety    Asthma    Hyperlipidemia    Obesity    Palpitations    Polycythemia    Vitamin D deficiency     No past surgical history on file.  Current Medications: Current Meds  Medication Sig   aspirin 81 MG chewable tablet Chew 81 mg by mouth daily. Takes rarely   propranolol (INDERAL) 20 MG tablet Take 1/2 tablet ( 10 mg ) twice a day   tadalafil (CIALIS) 10 MG tablet Take 1 tablet (10 mg total) by mouth every other day as needed for erectile dysfunction. Do not take more than once daily.   valsartan (DIOVAN) 80 MG tablet Take 1 tablet (80 mg total) by mouth 2 (two) times daily.     Allergies:   Codeine and Claritin [loratadine]   Social History    Socioeconomic History   Marital status: Single    Spouse name: Not on file   Number of children: Not on file   Years of education: Not on file   Highest education level: Not on file  Occupational History   Not on file  Tobacco Use   Smoking status: Never    Passive exposure: Never   Smokeless tobacco: Never  Vaping Use   Vaping status: Never Used  Substance and Sexual Activity   Alcohol use: Yes    Comment: Occasional    Drug use: No   Sexual activity: Not Currently  Other Topics Concern   Not on file  Social History Narrative   Not on file   Social Drivers of Health   Financial Resource Strain: Not on file  Food Insecurity: Not on file  Transportation Needs: Not on file  Physical Activity: Not on file  Stress: Not on file  Social Connections: Unknown (06/23/2022)   [facility]   Social Network    Social Network: Not on file     Family History: The patient's family history includes Colon cancer in his mother; Heart disease in his father.  ROS:   Review of Systems  Constitution: Negative for decreased appetite, fever and weight gain.  HENT: Negative for congestion, ear discharge, hoarse  voice and sore throat.   Eyes: Negative for discharge, redness, vision loss in right eye and visual halos.  Cardiovascular: Negative for chest pain, dyspnea on exertion, leg swelling, orthopnea and palpitations.  Respiratory: Negative for cough, hemoptysis, shortness of breath and snoring.   Endocrine: Negative for heat intolerance and polyphagia.  Hematologic/Lymphatic: Negative for bleeding problem. Does not bruise/bleed easily.  Skin: Negative for flushing, nail changes, rash and suspicious lesions.  Musculoskeletal: Negative for arthritis, joint pain, muscle cramps, myalgias, neck pain and stiffness.  Gastrointestinal: Negative for abdominal pain, bowel incontinence, diarrhea and excessive appetite.  Genitourinary: Negative for decreased libido,  genital sores and incomplete emptying.  Neurological: Negative for brief paralysis, focal weakness, headaches and loss of balance.  Psychiatric/Behavioral: Negative for altered mental status, depression and suicidal ideas.  Allergic/Immunologic: Negative for HIV exposure and persistent infections.    EKGs/Labs/Other Studies Reviewed:    The following studies were reviewed today:   EKG: Sinus rhythm, heart rate 82 bpm.  Recent Labs: 09/16/2022: ALT 23; BUN 17; Creatinine 1.03; Hemoglobin 17.5; Platelet Count 215; Potassium 3.4; Sodium 137  Recent Lipid Panel    Component Value Date/Time   CHOL 145 10/11/2022 1120   TRIG 187 (H) 10/11/2022 1120   HDL 41 10/11/2022 1120   CHOLHDL 3.5 10/11/2022 1120   LDLCALC 73 10/11/2022 1120    Physical Exam:    VS:  BP (!) 146/88   Pulse 82   Ht 5\' 7"  (1.702 m)   Wt 232 lb (105.2 kg)   SpO2 97%   BMI 36.34 kg/m     Wt Readings from Last 3 Encounters:  04/20/23 232 lb (105.2 kg)  10/11/22 217 lb (98.4 kg)  09/16/22 221 lb 1.6 oz (100.3 kg)     GEN: Well nourished, well developed in no acute distress HEENT: Normal NECK: No JVD; No carotid bruits LYMPHATICS: No lymphadenopathy CARDIAC: S1S2 noted,RRR, no murmurs, rubs, gallops RESPIRATORY:  Clear to auscultation without rales, wheezing or rhonchi  ABDOMEN: Soft, non-tender, non-distended, +bowel sounds, no guarding. EXTREMITIES: No edema, No cyanosis, no clubbing MUSCULOSKELETAL:  No deformity  SKIN: Warm and dry NEUROLOGIC:  Alert and oriented x 3, non-focal PSYCHIATRIC:  Normal affect, good insight  ASSESSMENT:    1. Vitamin D deficiency   2. Primary hypertension   3. Mixed hyperlipidemia   4. Morbid obesity (HCC)    PLAN:    Hypertension Blood pressure readings at home are variable, with some readings in the hypertensive range. Patient is currently on Valsartan and Propranolol. -Continue current medications. -Check blood pressure at home daily and record  readings. -Review technique for checking blood pressure at home to ensure accuracy.  Hyperlipidemia Patient is currently on Atorvastatin. Recent lipid panel results are pending. -Continue Atorvastatin. -Order lipid panel and Vitamin D level today. -Review lipid panel results and adjust Atorvastatin dose if necessary.  Intermittent leg pain Patient reports intermittent severe leg pain, possibly related to Atorvastatin use or electrolyte abnormalities. -Encourage hydration when taking Atorvastatin. -Monitor for persistence of leg pain.  He had previous testing by his PCP, he was told that this is not arterial disease.   Follow-up in 6 months.   The patient is in agreement with the above plan. The patient left the office in stable condition.  The patient will follow up in   Medication Adjustments/Labs and Tests Ordered: Current medicines are reviewed at length with the patient today.  Concerns regarding medicines are outlined above.  Orders Placed This Encounter  Procedures  Lipid panel   VITAMIN D 25 Hydroxy (Vit-D Deficiency, Fractures)   EKG 12-Lead   No orders of the defined types were placed in this encounter.   Patient Instructions  Medication Instructions:  Your physician recommends that you continue on your current medications as directed. Please refer to the Current Medication list given to you today.  *If you need a refill on your cardiac medications before your next appointment, please call your pharmacy*   Lab Work: Lipids, Vit D If you have labs (blood work) drawn today and your tests are completely normal, you will receive your results only by: MyChart Message (if you have MyChart) OR A paper copy in the mail If you have any lab test that is abnormal or we need to change your treatment, we will call you to review the results.    Follow-Up: At [facility], you and your health needs are our priority.  As part of our continuing mission to provide you  with exceptional heart care, we have created designated Provider Care Teams.  These Care Teams include your primary Cardiologist (physician) and Advanced Practice Providers (APPs -  Physician Assistants and Nurse Practitioners) who all work together to provide you with the care you need, when you need it.  Your next appointment:   6 month(s)  Provider:   Thomasene Ripple, DO       Adopting a Healthy Lifestyle.  Know what a healthy weight is for you (roughly BMI <25) and aim to maintain this   Aim for 7+ servings of fruits and vegetables daily   65-80+ fluid ounces of water or unsweet tea for healthy kidneys   Limit to max 1 drink of alcohol per day; avoid smoking/tobacco   Limit animal fats in diet for cholesterol and heart health - choose grass fed whenever available   Avoid highly processed foods, and foods high in saturated/trans fats   Aim for low stress - take time to unwind and care for your mental health   Aim for 150 min of moderate intensity exercise weekly for heart health, and weights twice weekly for bone health   Aim for 7-9 hours of sleep daily   When it comes to diets, agreement about the perfect plan isnt easy to find, even among the experts. Experts at the [facility] [employer] developed an idea known as the Healthy Eating Plate. Just imagine a plate divided into logical, healthy portions.   The emphasis is on diet quality:   Load up on vegetables and fruits - one-half of your plate: Aim for color and variety, and remember that potatoes dont count.   Go for whole grains - one-quarter of your plate: Whole wheat, barley, wheat berries, quinoa, oats, brown rice, and foods made with them. If you want pasta, go with whole wheat pasta.   Protein power - one-quarter of your plate: Fish, chicken, beans, and nuts are all healthy, versatile protein sources. Limit red meat.   The diet, however, does go beyond the plate, offering a few other suggestions.   Use  healthy plant oils, such as olive, canola, soy, corn, sunflower and peanut. Check the labels, and avoid partially hydrogenated oil, which have unhealthy trans fats.   If youre thirsty, drink water. Coffee and tea are good in moderation, but skip sugary drinks and limit milk and dairy products to one or two daily servings.   The type of carbohydrate in the diet is more important than the amount. Some sources of  carbohydrates, such as vegetables, fruits, whole grains, and beans-are healthier than others.   Finally, stay active  Signed, Thomasene Ripple, DO  04/20/2023 11:19 AM    [facility]

## 2023-04-20 NOTE — Patient Instructions (Signed)
Medication Instructions:  Your physician recommends that you continue on your current medications as directed. Please refer to the Current Medication list given to you today.  *If you need a refill on your cardiac medications before your next appointment, please call your pharmacy*   Lab Work: Lipids, Vit D If you have labs (blood work) drawn today and your tests are completely normal, you will receive your results only by: MyChart Message (if you have MyChart) OR A paper copy in the mail If you have any lab test that is abnormal or we need to change your treatment, we will call you to review the results.    Follow-Up: At [facility], you and your health needs are our priority.  As part of our continuing mission to provide you with exceptional heart care, we have created designated Provider Care Teams.  These Care Teams include your primary Cardiologist (physician) and Advanced Practice Providers (APPs -  Physician Assistants and Nurse Practitioners) who all work together to provide you with the care you need, when you need it.  Your next appointment:   6 month(s)  Provider:   Thomasene Ripple, DO

## 2023-04-21 ENCOUNTER — Other Ambulatory Visit: Payer: Self-pay | Admitting: Hematology and Oncology

## 2023-04-21 ENCOUNTER — Inpatient Hospital Stay: Payer: Medicaid Other | Attending: Hematology and Oncology

## 2023-04-21 ENCOUNTER — Inpatient Hospital Stay: Payer: Medicaid Other | Admitting: Hematology and Oncology

## 2023-04-21 VITALS — BP 129/100 | HR 85 | Temp 97.7°F | Resp 17 | Wt 230.5 lb

## 2023-04-21 DIAGNOSIS — Z800 Family history of malignant neoplasm of digestive organs: Secondary | ICD-10-CM | POA: Diagnosis not present

## 2023-04-21 DIAGNOSIS — R0683 Snoring: Secondary | ICD-10-CM | POA: Diagnosis not present

## 2023-04-21 DIAGNOSIS — D751 Secondary polycythemia: Secondary | ICD-10-CM | POA: Insufficient documentation

## 2023-04-21 DIAGNOSIS — Z7982 Long term (current) use of aspirin: Secondary | ICD-10-CM | POA: Insufficient documentation

## 2023-04-21 LAB — CBC WITH DIFFERENTIAL (CANCER CENTER ONLY)
Abs Immature Granulocytes: 0.01 10*3/uL (ref 0.00–0.07)
Basophils Absolute: 0.1 10*3/uL (ref 0.0–0.1)
Basophils Relative: 1 %
Eosinophils Absolute: 0.4 10*3/uL (ref 0.0–0.5)
Eosinophils Relative: 5 %
HCT: 52.4 % — ABNORMAL HIGH (ref 39.0–52.0)
Hemoglobin: 18.1 g/dL — ABNORMAL HIGH (ref 13.0–17.0)
Immature Granulocytes: 0 %
Lymphocytes Relative: 35 %
Lymphs Abs: 2.9 10*3/uL (ref 0.7–4.0)
MCH: 29.7 pg (ref 26.0–34.0)
MCHC: 34.5 g/dL (ref 30.0–36.0)
MCV: 86 fL (ref 80.0–100.0)
Monocytes Absolute: 0.6 10*3/uL (ref 0.1–1.0)
Monocytes Relative: 7 %
Neutro Abs: 4.2 10*3/uL (ref 1.7–7.7)
Neutrophils Relative %: 52 %
Platelet Count: 237 10*3/uL (ref 150–400)
RBC: 6.09 MIL/uL — ABNORMAL HIGH (ref 4.22–5.81)
RDW: 12.5 % (ref 11.5–15.5)
WBC Count: 8.1 10*3/uL (ref 4.0–10.5)
nRBC: 0 % (ref 0.0–0.2)

## 2023-04-21 LAB — CMP (CANCER CENTER ONLY)
ALT: 24 U/L (ref 0–44)
AST: 21 U/L (ref 15–41)
Albumin: 4.3 g/dL (ref 3.5–5.0)
Alkaline Phosphatase: 60 U/L (ref 38–126)
Anion gap: 8 (ref 5–15)
BUN: 22 mg/dL — ABNORMAL HIGH (ref 6–20)
CO2: 28 mmol/L (ref 22–32)
Calcium: 9.5 mg/dL (ref 8.9–10.3)
Chloride: 102 mmol/L (ref 98–111)
Creatinine: 1.11 mg/dL (ref 0.61–1.24)
GFR, Estimated: >60 mL/min (ref >60)
Glucose, Bld: 94 mg/dL (ref 70–99)
Potassium: 3.7 mmol/L (ref 3.5–5.1)
Sodium: 138 mmol/L (ref 135–145)
Total Bilirubin: 0.9 mg/dL (ref 0.0–1.2)
Total Protein: 7.1 g/dL (ref 6.5–8.1)

## 2023-04-21 NOTE — Progress Notes (Signed)
[facility] [phone]   [phone]  PROGRESS NOTE  Patient Care Team: Hilbert Bible, FNP as PCP - General (Family Medicine) Thomasene Ripple, DO as PCP - Cardiology (Cardiology)  CHIEF COMPLAINTS/PURPOSE OF CONSULTATION:  Secondary polycythemia  HISTORY OF PRESENTING ILLNESS:  Cory Frazier 48 y.o. 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   On exam today, Cory Frazier reports he has been well overall and interim since her last visit with no major changes in his health.  He is had no recent infectious symptoms such as runny nose, sore throat, or cough.  He is had no flu or COVID this season.  He reports his energy levels are overall good but sometimes he gets "tired out of nowhere".  He reports that his wife does complain that he snores.  He notes that he is taking his 81 mg aspirin p.o. daily.  He has gained 20 pounds in the interim since our prior visit and reports he is up since September.  His wife notes that he does continue to snore.  Otherwise he has been his baseline level of health and has no questions concerns or complaints today. He denies fevers, chills, sweats, shortness of breath, chest pain or cough. He has no other complaints. Rest of the 10 point ROS is below.   MEDICAL HISTORY:  Past Medical History:  Diagnosis Date   Anxiety    Asthma    Hyperlipidemia    Obesity    Palpitations    Polycythemia    Vitamin D deficiency     SURGICAL HISTORY: No past surgical history on file.  SOCIAL HISTORY: Social History   Socioeconomic History   Marital status: Single    Spouse name: Not on file   Number of children: Not on file   Years of education: Not on file   Highest education level: Not on file  Occupational History   Not on file  Tobacco Use   Smoking status: Never    Passive exposure: Never   Smokeless tobacco: Never  Vaping Use   Vaping status: Never Used  Substance and Sexual Activity   Alcohol use:  Yes    Comment: Occasional    Drug use: No   Sexual activity: Not Currently  Other Topics Concern   Not on file  Social History Narrative   Not on file   Social Drivers of Health   Financial Resource Strain: Not on file  Food Insecurity: Not on file  Transportation Needs: Not on file  Physical Activity: Not on file  Stress: Not on file  Social Connections: Unknown (06/23/2022)   Received from [facility], [facility]   Social Network    Social Network: Not on file  Intimate Partner Violence: Unknown (06/23/2022)   Received from [facility], [facility]   HITS    Physically Hurt: Not on file    Insult or Talk Down To: Not on file    Threaten Physical Harm: Not on file    Scream or Curse: Not on file    FAMILY HISTORY: Family History  Problem Relation Age of Onset   Colon cancer Mother    Heart disease Father     ALLERGIES:  is allergic to codeine and claritin [loratadine].  MEDICATIONS:  Current Outpatient Medications  Medication Sig Dispense Refill   aspirin 81 MG chewable tablet Chew 81 mg by mouth daily. Takes rarely     atorvastatin (LIPITOR) 20 MG tablet Take 1 tablet (20  mg total) by mouth daily. 30 tablet 2   hydrochlorothiazide (MICROZIDE) 12.5 MG capsule Take 1 capsule (12.5 mg total) by mouth 2 (two) times daily. 180 capsule 1   propranolol (INDERAL) 20 MG tablet Take 1/2 tablet ( 10 mg ) twice a day     tadalafil (CIALIS) 10 MG tablet Take 1 tablet (10 mg total) by mouth every other day as needed for erectile dysfunction. Do not take more than once daily. 10 tablet 1   valsartan (DIOVAN) 80 MG tablet Take 1 tablet (80 mg total) by mouth 2 (two) times daily. 180 tablet 3   No current facility-administered medications for this visit.    REVIEW OF SYSTEMS:   Constitutional: ( - ) fevers, ( - )  chills , ( - ) night sweats Eyes: ( - ) blurriness of vision, ( - ) double vision, ( - ) watery eyes Ears, nose, mouth, throat, and face: ( - ) mucositis, ( - )  sore throat Respiratory: ( - ) cough, ( - ) dyspnea, ( - ) wheezes Cardiovascular: ( - ) palpitation, ( - ) chest discomfort, ( - ) lower extremity swelling Gastrointestinal:  ( - ) nausea, ( - ) heartburn, ( - ) change in bowel habits Skin: ( - ) abnormal skin rashes Lymphatics: ( - ) new lymphadenopathy, ( - ) easy bruising Neurological: ( - ) numbness, ( - ) tingling, ( - ) new weaknesses Behavioral/Psych: ( - ) mood change, ( - ) new changes  All other systems were reviewed with the patient and are negative.  PHYSICAL EXAMINATION: ECOG PERFORMANCE STATUS: 1 - Symptomatic but completely ambulatory  Vitals:   04/21/23 0930 04/21/23 0931  BP: (!) 125/103 (!) 129/100  Pulse: 85   Resp: 17   Temp: 97.7 F (36.5 C)   SpO2: 98%     Filed Weights   04/21/23 0930  Weight: 230 lb 8 oz (104.6 kg)     GENERAL: well appearing male in NAD  SKIN: skin color, texture, turgor are normal, no rashes or significant lesions EYES: conjunctiva are pink and non-injected, sclera clear LUNGS: clear to auscultation and percussion with normal breathing effort HEART: regular rate & rhythm and no murmurs and no lower extremity edema Musculoskeletal: no cyanosis of digits and no clubbing  PSYCH: alert & oriented x 3, fluent speech NEURO: no focal motor/sensory deficits  LABORATORY DATA:  I have reviewed the data as listed    Latest Ref Rng & Units 04/21/2023    9:12 AM 09/16/2022   10:10 AM 03/16/2022   12:16 PM  CBC  WBC 4.0 - 10.5 K/uL 8.1  8.1  5.5   Hemoglobin 13.0 - 17.0 g/dL 40.9  81.1  91.4   Hematocrit 39.0 - 52.0 % 52.4  48.8  49.8   Platelets 150 - 400 K/uL 237  215  210        Latest Ref Rng & Units 04/21/2023    9:12 AM 09/16/2022   10:10 AM 03/16/2022   12:16 PM  CMP  Glucose 70 - 99 mg/dL 94  86  782   BUN 6 - 20 mg/dL 22  17  13    Creatinine 0.61 - 1.24 mg/dL 9.56  2.13  0.86   Sodium 135 - 145 mmol/L 138  137  134   Potassium 3.5 - 5.1 mmol/L 3.7  3.4  3.5   Chloride  98 - 111 mmol/L 102  102  99   CO2 22 -  32 mmol/L 28  29  30    Calcium 8.9 - 10.3 mg/dL 9.5  9.6  9.9   Total Protein 6.5 - 8.1 g/dL 7.1  7.4  7.1   Total Bilirubin 0.0 - 1.2 mg/dL 0.9  0.7  0.7   Alkaline Phos 38 - 126 U/L 60  67  52   AST 15 - 41 U/L 21  19  25    ALT 0 - 44 U/L 24  23  36    RADIOGRAPHIC STUDIES: I have personally reviewed the radiological images as listed and agreed with the findings in the report. No results found.  ASSESSMENT & PLAN Cory Frazier is a 48 y.o. male who returns for a follow up for polycythemia. 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  --patient is a non smoker and does not use any testosterone containing products --patient has symptoms concerning for sleep apnea, but was not able to complete a sleep study as it wasn't approved by his insurance at the time. We will refer again for polysomnography  --labs today show WBC 8.1, Hgb 18.1, MCV 86.0, Plt 237  --RTC in 6 months or sooner if indicated by the above labs.   No orders of the defined types were placed in this encounter.  All questions were answered. The patient knows to call the clinic with any problems, questions or concerns.  I have spent a total of 25 minutes minutes of face-to-face and non-face-to-face time, preparing to see the patient, performing a medically appropriate examination, counseling and educating the patient, referring and communicating with other health care professionals, documenting clinical information in the electronic health record,  and care coordination.   Ulysees Barns, MD Department of Hematology/Oncology [facility] Phone: [phone] Pager: [phone] Email: [email or web address]

## 2023-04-22 LAB — ERYTHROPOIETIN: Erythropoietin: 6.4 m[IU]/mL (ref 2.6–18.5)

## 2023-04-24 ENCOUNTER — Other Ambulatory Visit (HOSPITAL_BASED_OUTPATIENT_CLINIC_OR_DEPARTMENT_OTHER): Payer: Medicaid Other

## 2023-04-25 ENCOUNTER — Encounter: Payer: Self-pay | Admitting: Cardiology

## 2023-05-01 ENCOUNTER — Ambulatory Visit (INDEPENDENT_AMBULATORY_CARE_PROVIDER_SITE_OTHER): Payer: Medicaid Other | Admitting: Family Medicine

## 2023-05-01 ENCOUNTER — Encounter (HOSPITAL_BASED_OUTPATIENT_CLINIC_OR_DEPARTMENT_OTHER): Payer: Self-pay | Admitting: Family Medicine

## 2023-05-01 VITALS — BP 134/80 | HR 79 | Ht 67.0 in | Wt 227.8 lb

## 2023-05-01 DIAGNOSIS — Z1211 Encounter for screening for malignant neoplasm of colon: Secondary | ICD-10-CM | POA: Diagnosis not present

## 2023-05-01 DIAGNOSIS — E785 Hyperlipidemia, unspecified: Secondary | ICD-10-CM | POA: Diagnosis not present

## 2023-05-01 DIAGNOSIS — E559 Vitamin D deficiency, unspecified: Secondary | ICD-10-CM | POA: Diagnosis not present

## 2023-05-01 DIAGNOSIS — Z0000 Encounter for general adult medical examination without abnormal findings: Secondary | ICD-10-CM

## 2023-05-01 DIAGNOSIS — I10 Essential (primary) hypertension: Secondary | ICD-10-CM

## 2023-05-01 DIAGNOSIS — D751 Secondary polycythemia: Secondary | ICD-10-CM

## 2023-05-01 NOTE — Progress Notes (Signed)
 Subjective:   Cory Frazier 1975-09-14 05/01/2023  CC: Chief Complaint  Patient presents with   Annual Exam    Patient is here today for his physical. States he still has concerns with shin pain that has happens off and on.    HPI: Cory Frazier is a 48 y.o. male who presents for a routine health maintenance exam.  Labs collected at time of visit.   HEALTH SCREENINGS: - Vision Screening: not applicable - Dental Visits:  Recommended - Testicular Exam: Declined - STD Screening: Declined - PSA (50+): Not applicable   Lab Results  Component Value Date   PSA1 1.0 06/29/2020     - Colonoscopy (45+): Ordered today  Discussed with patient purpose of the colonoscopy is to detect colon cancer at curable precancerous or early stages  - AAA Screening: Not applicable  Men age 7-75 who have ever smoked - Lung Cancer screening with low-dose CT: Not applicable-  Adults age 30-80 who are current cigarette smokers or quit within the last 15 years. Must have 20 pack year history.   Depression and Anxiety Screen done today and results listed below:     05/01/2023   10:22 AM 03/29/2022   10:17 AM 02/28/2022   10:07 AM 02/08/2022    2:37 PM 04/07/2021    9:43 AM  Depression screen PHQ 2/9  Decreased Interest 1  2 2  0  Down, Depressed, Hopeless 3  3 3  0  PHQ - 2 Score 4  5 5  0  Altered sleeping 1  0 0   Tired, decreased energy 2  1 1    Change in appetite 3  0 0   Feeling bad or failure about yourself  3  3 3    Trouble concentrating 0  0 0   Moving slowly or fidgety/restless 0  0 0   Suicidal thoughts 0  0 0   PHQ-9 Score 13  9 9    Difficult doing work/chores Extremely dIfficult  Somewhat difficult Somewhat difficult      Information is confidential and restricted. Go to Review Flowsheets to unlock data.      05/01/2023   10:24 AM 03/29/2022   10:27 AM 02/28/2022   10:07 AM 01/05/2021    9:32 AM  GAD 7 : Generalized Anxiety Score  Nervous, Anxious, on Edge 3  3 2    Control/stop worrying 3  3 3   Worry too much - different things 3  1 3   Trouble relaxing 3  1 1   Restless 2  1 1   Easily annoyed or irritable 3  1 1   Afraid - awful might happen 3  3 3   Total GAD 7 Score 20  13 14   Anxiety Difficulty Extremely difficult  Somewhat difficult      Information is confidential and restricted. Go to Review Flowsheets to unlock data.    IMMUNIZATIONS:  - Tdap: Tetanus vaccination status reviewed: Declined. - Influenza: Refused - Pneumovax: Not applicable - Prevnar: Not applicable - Shingrix vaccine (50+): Not applicable   Past medical history, surgical history, medications, allergies, family history and social history reviewed with patient today and changes made to appropriate areas of the chart.   Past Medical History:  Diagnosis Date   Anxiety    Asthma    Hyperlipidemia    Obesity    Palpitations    Polycythemia    Vitamin D  deficiency     History reviewed. No pertinent surgical history.  Current Outpatient Medications on File Prior to Visit  Medication  Sig   aspirin 81 MG chewable tablet Chew 81 mg by mouth daily. Takes rarely   atorvastatin  (LIPITOR) 20 MG tablet Take 20 mg by mouth daily.   propranolol  (INDERAL ) 20 MG tablet Take 1/2 tablet ( 10 mg ) twice a day   tadalafil  (CIALIS ) 10 MG tablet Take 1 tablet (10 mg total) by mouth every other day as needed for erectile dysfunction. Do not take more than once daily.   valsartan  (DIOVAN ) 80 MG tablet Take 1 tablet (80 mg total) by mouth 2 (two) times daily.   hydrochlorothiazide  (MICROZIDE ) 12.5 MG capsule Take 1 capsule (12.5 mg total) by mouth 2 (two) times daily.   No current facility-administered medications on file prior to visit.    Allergies  Allergen Reactions   Codeine Other (See Comments)    unknown unknown   Claritin [Loratadine] Anxiety     Social History   Socioeconomic History   Marital status: Single    Spouse name: Not on file   Number of children: Not on  file   Years of education: Not on file   Highest education level: Not on file  Occupational History   Not on file  Tobacco Use   Smoking status: Never    Passive exposure: Never   Smokeless tobacco: Never  Vaping Use   Vaping status: Never Used  Substance and Sexual Activity   Alcohol use: Yes    Comment: Occasional    Drug use: No   Sexual activity: Not Currently  Other Topics Concern   Not on file  Social History Narrative   Not on file   Social Drivers of Health   Financial Resource Strain: Patient Declined (05/01/2023)   Overall Financial Resource Strain (CARDIA)    Difficulty of Paying Living Expenses: Patient declined  Food Insecurity: No Food Insecurity (05/01/2023)   Hunger Vital Sign    Worried About Running Out of Food in the Last Year: Never true    Ran Out of Food in the Last Year: Never true  Transportation Needs: No Transportation Needs (05/01/2023)   PRAPARE - Administrator, Civil Service (Medical): No    Lack of Transportation (Non-Medical): No  Physical Activity: Inactive (05/01/2023)   Exercise Vital Sign    Days of Exercise per Week: 0 days    Minutes of Exercise per Session: 0 min  Stress: Stress Concern Present (05/01/2023)   [name]    Feeling of Stress : Rather much  Social Connections: Socially Isolated (05/01/2023)   Social Connection and Isolation Panel [NHANES]    Frequency of Communication with Friends and Family: More than three times a week    Frequency of Social Gatherings with Friends and Family: Once a week    Attends Religious Services: Never    Database administrator or Organizations: No    Attends Banker Meetings: Never    Marital Status: Never married  Intimate Partner Violence: Not At Risk (05/01/2023)   Humiliation, Afraid, Rape, and Kick questionnaire    Fear of Current or Ex-Partner: No    Emotionally Abused: No    Physically Abused: No     Sexually Abused: No   Social History   Tobacco Use  Smoking Status Never   Passive exposure: Never  Smokeless Tobacco Never   Social History   Substance and Sexual Activity  Alcohol Use Yes   Comment: Occasional      Family History  Problem Relation Age of Onset   Colon cancer Mother    Heart disease Father      ROS: Denies fever, fatigue, unexplained weight loss/gain, CP, SHOB, and palpatitations. Denies neurological deficits, gastrointestinal and/or genitourinary complaints, and skin changes.   Objective:   Today's Vitals   05/01/23 1017 05/01/23 1051  BP: (!) 132/102 134/80  Pulse: 79   SpO2: 100%   Weight: 227 lb 12.8 oz (103.3 kg)   Height: 5\' 7"  (1.702 m)     GENERAL APPEARANCE: Well-appearing, in NAD. Well nourished.  SKIN: Pink, warm and dry. Turgor normal. No rash, lesion, ulceration, or ecchymoses. Hair evenly distributed.  HEENT: HEAD: Normocephalic.  EYES: PERRLA. EOMI. Lids intact w/o defect. Sclera white, Conjunctiva pink w/o exudate.  EARS: External ear w/o redness, swelling, masses or lesions. EAC clear. TM's intact, translucent w/o bulging, appropriate landmarks visualized. Appropriate acuity to conversational tones.  NOSE: Septum midline w/o deformity. Nares patent, mucosa pink and non-inflamed w/o drainage. No sinus tenderness.  THROAT: Uvula midline. Oropharynx clear. Tonsils non-inflamed w/o exudate. Oral mucosa pink and moist.  NECK: Supple, Trachea midline. Full ROM w/o pain or tenderness. No lymphadenopathy. Thyroid  non-tender w/o enlargement or palpable masses.  RESPIRATORY: Chest wall symmetrical w/o masses. Respirations even and non-labored. Breath sounds clear to auscultation bilaterally. No wheezes, rales, rhonchi, or crackles. CARDIAC: S1, S2 present, regular rate and rhythm. No gallops, murmurs, rubs, or clicks. PMI w/o lifts, heaves, or thrills. No carotid bruits. Capillary refill <2 seconds. Peripheral pulses 2+ bilaterally. GI:  Abdomen soft w/o distention. Normoactive bowel sounds. No palpable masses or tenderness. No guarding or rebound tenderness. Liver and spleen w/o tenderness or enlargement. No CVA tenderness.  GU: Pt deferred exam. MSK: Muscle tone and strength appropriate for age, w/o atrophy or abnormal movement. EXTREMITIES: Active ROM intact, w/o tenderness, crepitus, or contracture. No obvious joint deformities or effusions. No clubbing, edema, or cyanosis.  NEUROLOGIC: CN's II-XII intact. Motor strength symmetrical with no obvious weakness. No sensory deficits. DTR 2+ symmetric bilaterally. Steady, even gait.  PSYCH/MENTAL STATUS: Alert, oriented x 3. Cooperative, appropriate mood and affect.   Lab Results  Component Value Date   CHOL 181 04/20/2023   HDL 51 04/20/2023   LDLCALC 102 (H) 04/20/2023   TRIG 162 (H) 04/20/2023   CHOLHDL 3.5 04/20/2023   Last vitamin D  Lab Results  Component Value Date   VD25OH 29.7 (L) 04/20/2023   Lab Results  Component Value Date   WBC 8.1 04/21/2023   HGB 18.1 (H) 04/21/2023   HCT 52.4 (H) 04/21/2023   MCV 86.0 04/21/2023   PLT 237 04/21/2023   CMP     Component Value Date/Time   NA 138 04/21/2023 0912   NA 138 02/08/2022 1531   K 3.7 04/21/2023 0912   CL 102 04/21/2023 0912   CO2 28 04/21/2023 0912   GLUCOSE 94 04/21/2023 0912   BUN 22 (H) 04/21/2023 0912   BUN 19 02/08/2022 1531   CREATININE 1.11 04/21/2023 0912   CALCIUM  9.5 04/21/2023 0912   PROT 7.1 04/21/2023 0912   PROT 7.1 02/08/2022 1531   ALBUMIN 4.3 04/21/2023 0912   ALBUMIN 4.7 02/08/2022 1531   AST 21 04/21/2023 0912   ALT 24 04/21/2023 0912   ALKPHOS 60 04/21/2023 0912   BILITOT 0.9 04/21/2023 0912   EGFR 87 02/08/2022 1531   GFRNONAA >60 04/21/2023 0912     Assessment & Plan:  1. Annual physical exam (Primary) .  We discussed healthy exercise, nutrition, preventative  screenings and vaccinations.  Patient declined routine vaccinations and STI testing today.  Patient did not  discuss concerns regarding mental health with PHQ-9 or GAD-7.  2. Screening for colon cancer Referral placed to GI for routine colonoscopy. - Ambulatory referral to Gastroenterology  3. Essential (primary) hypertension Currently well-controlled.  Patient to continue on current regimen and is currently managed by cardiology.  Recent lab work reviewed with patient.  4. Polycythemia Currently followed by hematology and well-controlled.  Patient is scheduled for a OSA evaluation.  5. Vitamin D  deficiency Improved.  Patient to continue 5000 units vitamin D3 every other day and repeat in approximately 1 year.  6. Hyperlipidemia, unspecified hyperlipidemia type Currently triglycerides slightly elevated.  Patient was previously able to lower this with medication, dietary changes and lifestyle changes.  He is currently trying to increase nutrition and exercise and is followed for his cholesterol by cardiology.    Orders Placed This Encounter  Procedures   Ambulatory referral to Gastroenterology    Referral Priority:   Routine    Referral Type:   Consultation    Referral Reason:   Specialty Services Required    Number of Visits Requested:   1    PATIENT COUNSELING: - Encouraged to adjust caloric intake to maintain or achieve ideal body weight, to reduce intake of dietary saturated fat and total fat, to limit sodium intake by avoiding high sodium foods and not adding table salt, and to maintain adequate dietary potassium and calcium  preferably from fresh fruits, vegetables, and low-fat dairy products.   - Advised to avoid cigarette smoking. - Discussed with the patient that most people either abstain from alcohol or drink within safe limits (<=14/week and <=4 drinks/occasion for males, <=7/weeks and <= 3 drinks/occasion for females) and that the risk for alcohol disorders and other health effects rises proportionally with the number of drinks per week and how often a drinker exceeds daily  limits. - Discussed cessation/primary prevention of drug use and availability of treatment for abuse.   - Stressed the importance of regular exercise - Injury prevention: Discussed safety belts, safety helmets, smoke detector, smoking near bedding or upholstery.  - Dental health: Discussed importance of regular tooth brushing, flossing, and dental visits.  - Sexuality: Discussed sexually transmitted diseases, partner selection, use of condoms, avoidance of unintended pregnancy  and contraceptive alternatives.   NEXT PREVENTATIVE PHYSICAL DUE IN 1 YEAR.  Return in about 1 year (around 04/30/2024) for ANNUAL PHYSICAL (labs at visit) .  Patient to reach out to office if new, worrisome, or unresolved symptoms arise or if no improvement in patient's condition. Patient verbalized understanding and is agreeable to treatment plan. All questions answered to patient's satisfaction.    Nonda Bays, [place]

## 2023-05-17 ENCOUNTER — Encounter: Payer: Self-pay | Admitting: Neurology

## 2023-05-17 ENCOUNTER — Ambulatory Visit (INDEPENDENT_AMBULATORY_CARE_PROVIDER_SITE_OTHER): Payer: Medicaid Other | Admitting: Neurology

## 2023-05-17 VITALS — BP 143/79 | HR 89 | Ht 66.0 in | Wt 232.0 lb

## 2023-05-17 DIAGNOSIS — F518 Other sleep disorders not due to a substance or known physiological condition: Secondary | ICD-10-CM | POA: Insufficient documentation

## 2023-05-17 DIAGNOSIS — J342 Deviated nasal septum: Secondary | ICD-10-CM

## 2023-05-17 DIAGNOSIS — R0681 Apnea, not elsewhere classified: Secondary | ICD-10-CM | POA: Diagnosis not present

## 2023-05-17 DIAGNOSIS — J322 Chronic ethmoidal sinusitis: Secondary | ICD-10-CM

## 2023-05-17 DIAGNOSIS — Z9189 Other specified personal risk factors, not elsewhere classified: Secondary | ICD-10-CM | POA: Insufficient documentation

## 2023-05-17 DIAGNOSIS — R0683 Snoring: Secondary | ICD-10-CM | POA: Insufficient documentation

## 2023-05-17 DIAGNOSIS — K219 Gastro-esophageal reflux disease without esophagitis: Secondary | ICD-10-CM

## 2023-05-17 DIAGNOSIS — D751 Secondary polycythemia: Secondary | ICD-10-CM | POA: Diagnosis not present

## 2023-05-17 NOTE — Progress Notes (Signed)
 SLEEP MEDICINE CLINIC    Provider:  Melvyn Novas, MD  Primary Care Physician:  Hilbert Bible, FNP [address] [place] [address]     Referring Provider: Jaci Standard, Md [address] [place],  [place] [address]          Chief Complaint according to patient   Patient presents with:     New Patient (Initial Visit); Rm 1 with friend Larey Brick Pt is well, reports he has a habit of taking naps since he was a teen. He is a heavy sleeper. Gets about 4-6 hrs of sleep at a time. Does not wake up fatigued. Does snore loudly in supine and twitches in his sleep. Sleep myoclonus, vivid dreams He has woken up gasping for breath- but not often.            HISTORY OF PRESENT ILLNESS:  Cory Frazier is a 48 y.o. male patient who is seen upon  hematologist/ Oncologist referral from on 05/17/2023  for a sleep apnea evaluation. He presents with polycythemia vera, and his cardiologist ( Dr. Servando Salina) .  has seen ENT ( Chronic ethmoidal sinusitis (Primary Dx);  Seasonal allergic rhinitis due to pollen;  Deviated nasal septum;  Orthostatic dizziness;  Hypertension, unspecified type;  Sleep-disordered breathing )  and GI , dx with laryngeal reflux. GERD.   Chief concern according to patient :  " I have variable times for sleep and meals, no routines, not ever sleeping longer than 4 hours. Loud snoring,  nasal and sinus congstion, Acid reflux)  and I can't go to sleep, my mind is racing.  I am heavier.  All physician have  wanted him to have a sleep study.    The patient never had a sleep study, Sleep relevant medical history: Nocturia 1-2 times ,  Vivid dreams, no ent surgery but known  deviated septum , chronic sinus. Adenoid hypertrophy. Chronic rhinitis, no TBI no neck injuries  Polycythemia vera.     Family medical /sleep history: No  other family member on CPAP with OSA, insomnia, sleep walkers.    Social history:  Patient is working as a Actuary- keyboard, Tax adviser.     and lives in a household with Mayo Ao ,  No children, no pets.  The patient currently works/ used to work in shifts( Chief Technology Officer,) he works Biochemist, clinical at night.   Tobacco use: none .   ETOH use ;  seldomly ,  Caffeine intake - none  Exercise ;  no routines    Sleep habits are as follows:  the patient has been a life long short sleeper,  he has been depressed and sometimes is unable to go to sleep. But a 4-5 hours sleep time was normal even during his teenage.  Musicain with variable day and night activities.  The patient's dinner time is variable , can be as late as 11 Pm- The patient goes to bed at 1-  3 AM and continues to  struggle to go to sleep ( chronic lifelong insomnia ), may then sleep  for 4-6  hours, wakes for 1-2 bathroom breaks, and always  awake by 7.30 AM.   The preferred sleep position is supine or lateral , with the support of  3-5 pillows. Propped up, in a flat bed. GERD .  Often not enough time between meals and sleep.   Bedroom: is dark, cool and quiet. Dreams are reportedly frequent/vivid.  The patient wakes up spontaneously at 7 - 7.30. He reports feeling refreshed or restored in AM,  but he is sleepy after lunch, naps  for 30 minutes - feels good- but sometimes this nap may be 90 minutes-Naps are taken frequently, and are refreshing .  Longer naps interferes with nocturnal sleep.    Review of Systems: Out of a complete 14 system review, the patient complains of only the following symptoms, and all other reviewed systems are negative.:  Fatigue, sleepiness , snoring, fragmented sleep, Insomnia,   How likely are you to doze in the following situations: 0 = not likely, 1 = slight chance, 2 = moderate chance, 3 = high chance   Sitting and Reading? Watching Television? Sitting inactive in a public place (theater or meeting)? As a passenger in a car for an hour without a break? Lying down in the afternoon when circumstances  permit? Sitting and talking to someone? Sitting quietly after lunch without alcohol? In a car, while stopped for a few minutes in traffic?   Total = 13/ 24 points   FSS endorsed at 24/ 63 points.   Social History   Socioeconomic History   Marital status: Single    Spouse name: Not on file   Number of children: Not on file   Years of education: Not on file   Highest education level: Not on file  Occupational History   Not on file  Tobacco Use   Smoking status: Never    Passive exposure: Never   Smokeless tobacco: Never  Vaping Use   Vaping status: Never Used  Substance and Sexual Activity   Alcohol use: Yes    Comment: Occasional    Drug use: No   Sexual activity: Not Currently  Other Topics Concern   Not on file  Social History Narrative   Not on file   Social Drivers of Health   Financial Resource Strain: Patient Declined (05/01/2023)   Overall Financial Resource Strain (CARDIA)    Difficulty of Paying Living Expenses: Patient declined  Food Insecurity: No Food Insecurity (05/01/2023)   Hunger Vital Sign    Worried About Running Out of Food in the Last Year: Never true    Ran Out of Food in the Last Year: Never true  Transportation Needs: No Transportation Needs (05/01/2023)   PRAPARE - Administrator, Civil Service (Medical): No    Lack of Transportation (Non-Medical): No  Physical Activity: Inactive (05/01/2023)   Exercise Vital Sign    Days of Exercise per Week: 0 days    Minutes of Exercise per Session: 0 min  Stress: Stress Concern Present (05/01/2023)   [name]    Feeling of Stress : Rather much  Social Connections: Socially Isolated (05/01/2023)   Social Connection and Isolation Panel [NHANES]    Frequency of Communication with Friends and Family: More than three times a week    Frequency of Social Gatherings with Friends and Family: Once a week    Attends Religious Services:  Never    Database administrator or Organizations: No    Attends Engineer, structural: Never    Marital Status: Never married    Family History  Problem Relation Age of Onset   Colon cancer Mother    Heart disease Father     Past Medical History:  Diagnosis Date   Anxiety    Asthma    Hyperlipidemia    Obesity  Palpitations    Polycythemia    Vitamin D deficiency     History reviewed. No pertinent surgical history.   Current Outpatient Medications on File Prior to Visit  Medication Sig Dispense Refill   aspirin 81 MG chewable tablet Chew 81 mg by mouth daily. Takes rarely     atorvastatin (LIPITOR) 20 MG tablet Take 20 mg by mouth daily.     hydrochlorothiazide (MICROZIDE) 12.5 MG capsule Take 1 capsule (12.5 mg total) by mouth 2 (two) times daily. 180 capsule 1   propranolol (INDERAL) 20 MG tablet Take 1/2 tablet ( 10 mg ) twice a day     tadalafil (CIALIS) 10 MG tablet Take 1 tablet (10 mg total) by mouth every other day as needed for erectile dysfunction. Do not take more than once daily. 10 tablet 1   valsartan (DIOVAN) 80 MG tablet Take 1 tablet (80 mg total) by mouth 2 (two) times daily. 180 tablet 3   No current facility-administered medications on file prior to visit.    Allergies  Allergen Reactions   Codeine Other (See Comments)    unknown unknown   Claritin [Loratadine] Anxiety     DIAGNOSTIC DATA (LABS, IMAGING, TESTING) - I reviewed patient records, labs, notes, testing and imaging myself where available.  Lab Results  Component Value Date   WBC 8.1 04/21/2023   HGB 18.1 (H) 04/21/2023   HCT 52.4 (H) 04/21/2023   MCV 86.0 04/21/2023   PLT 237 04/21/2023      Component Value Date/Time   NA 138 04/21/2023 0912   NA 138 02/08/2022 1531   K 3.7 04/21/2023 0912   CL 102 04/21/2023 0912   CO2 28 04/21/2023 0912   GLUCOSE 94 04/21/2023 0912   BUN 22 (H) 04/21/2023 0912   BUN 19 02/08/2022 1531   CREATININE 1.11 04/21/2023 0912    CALCIUM 9.5 04/21/2023 0912   PROT 7.1 04/21/2023 0912   PROT 7.1 02/08/2022 1531   ALBUMIN 4.3 04/21/2023 0912   ALBUMIN 4.7 02/08/2022 1531   AST 21 04/21/2023 0912   ALT 24 04/21/2023 0912   ALKPHOS 60 04/21/2023 0912   BILITOT 0.9 04/21/2023 0912   GFRNONAA >60 04/21/2023 0912   Lab Results  Component Value Date   CHOL 181 04/20/2023   HDL 51 04/20/2023   LDLCALC 102 (H) 04/20/2023   TRIG 162 (H) 04/20/2023   CHOLHDL 3.5 04/20/2023   Lab Results  Component Value Date   HGBA1C 5.3 02/28/2022   Lab Results  Component Value Date   VITAMINB12 410 06/29/2020   Lab Results  Component Value Date   TSH 1.470 02/28/2022    PHYSICAL EXAM:  Today's Vitals   05/17/23 1017  BP: (!) 143/79  Pulse: 89  Weight: 232 lb (105.2 kg)  Height: 5\' 6"  (1.676 m)   Body mass index is 37.45 kg/m.   Wt Readings from Last 3 Encounters:  05/17/23 232 lb (105.2 kg)  05/01/23 227 lb 12.8 oz (103.3 kg)  04/21/23 230 lb 8 oz (104.6 kg)     Ht Readings from Last 3 Encounters:  05/17/23 5\' 6"  (1.676 m)  05/01/23 5\' 7"  (1.702 m)  04/20/23 5\' 7"  (1.702 m)      General: The patient is awake, alert and appears not in acute distress.   Head: Normocephalic, atraumatic. Neck is supple.  Mallampati 3,  neck circumference:18. 25 ' inches . Nasal airflow on the right is  patent. Left is restricted,   Retrognathia is not  seen.  Dental status:  biological , regular  Cardiovascular:  Regular rate and cardiac rhythm by pulse,  without distended neck veins. Respiratory: Lungs are clear to auscultation.  Skin:  Without evidence of ankle edema, or rash. Trunk: The patient's posture is erect.   NEUROLOGIC EXAM: The patient is awake and alert, oriented to place and time.   Memory subjective described as intact.  Attention span & concentration ability appears normal.  Speech is fluent,  without  dysarthria, dysphonia or aphasia.  Mood and affect are appropriate.   Cranial nerves: no loss of  smell or taste reported  Pupils are equal and briskly reactive to light. Funduscopic exam deferred.  Extraocular movements in vertical and horizontal planes were intact and without nystagmus. No Diplopia. Visual fields by finger perimetry are intact. Hearing was intact to soft voice and finger rubbing.   Facial sensation intact to fine touch.  Facial motor strength is symmetric and tongue and uvula move midline.  Neck ROM : rotation, tilt and flexion extension were normal for age and shoulder shrug was symmetrical.    Motor exam:  Symmetric bulk, tone and ROM.   Normal tone without cog-wheeling, symmetric grip strength .   Coordination: Rapid alternating movements in the fingers/hands were of normal speed.  The Finger-to-nose maneuver was intact without evidence of ataxia, dysmetria or tremor.   Gait and station: Patient could rise unassisted from a seated position, walked without assistive device.  Deep tendon reflexes: in the  upper and lower extremities are symmetric and intact.      ASSESSMENT AND PLAN 48 y.o. year old male  here with:    1)  life long short sleeper - this patient may always get by with 5 hours of sleep , feels good and takes naps after meals.   2)  insomnia  is also related to sleep hygiene and habits.  He is a delayed phase sleeper with a functioning morning rise time- and is not groggy.   3) high risk of OSA  by reports of snoring and BMI and neck size and airway anatomy,   SLEEP HYGIENE :  he needs to get the  screens out of the bedroom, wean off screen time before going to bed, 45 minutes or longer, rather read in a book with pages, and  hot shower before bed time.   Sleeping elevated for GERD,helps breathing better.   Use the nasal spray  at night.   Consider ENT treatments and depression treatment.   I ordered a HST , based on his sleep times which do not correlate with an in lab sleep study.   I plan to follow up  through our NP within 4-5   months.   I would like to thank Caudle, Shelton Silvas, FNP and Jaci Standard, Md [address] [place],  [place] [address] for allowing me to meet with and to take care of this pleasant patient.   CC: I will share my notes with Dr Ernestene Kiel.   After spending a total time of  45  minutes face to face and additional time for physical and neurologic examination, review of laboratory studies,  personal review of imaging studies, reports and results of other testing and review of referral information / records as far as provided in visit,   Electronically signed by: Melvyn Novas, MD 05/17/2023 10:35 AM  [facility] and [facility] Sleep Board certified by The [employer] of Sleep Medicine and Diplomate of the [employer]  Academy of Sleep Medicine. Board certified In Neurology through the ABPN, Fellow of the [employer] of Neurology.

## 2023-05-17 NOTE — Patient Instructions (Addendum)
 ASSESSMENT AND PLAN 48 y.o. year old male  here with:    1)  life long short sleeper - this patient may always get by with 5 hours of sleep , feels good and takes naps after meals.   2)  insomnia  is also related to sleep hygiene and habits.  He is a delayed phase sleeper with a functioning morning rise time- and is not groggy.   3) high risk of OSA  by reports of snoring and BMI and neck size and airway anatomy,   SLEEP HYGIENE :  You need to get the lit-screens out of the bedroom, wean off screen time before going to bed, 45 minutes or longer, rather read in a book with pages, and take a  hot shower before bed time.  This relaxes and unclogs the nasal airway too.   Sleeping elevated on 2-3 pillows for GERD, this helps breathing better.   Use the nasal spray at night.   Consider ENT treatments and depression treatment.   I ordered a HST , based on his sleep times which do not correlate with an in lab sleep study.   I plan to follow up  through our NP within 4-5  months.   I would like to thank Caudle, Shelton Silvas, FNP and Jaci Standard, Md Insomnia Insomnia is a sleep disorder that makes it difficult to fall asleep or stay asleep. Insomnia can cause fatigue, low energy, difficulty concentrating, mood swings, and poor performance at work or school. There are three different ways to classify insomnia: Difficulty falling asleep. Difficulty staying asleep. Waking up too early in the morning. Any type of insomnia can be long-term (chronic) or short-term (acute). Both are common. Short-term insomnia usually lasts for 3 months or less. Chronic insomnia occurs at least three times a week for longer than 3 months. What are the causes? Insomnia may be caused by another condition, situation, or substance, such as: Having certain mental health conditions, such as anxiety and depression. Using caffeine, alcohol, tobacco, or drugs. Having gastrointestinal conditions, such as  gastroesophageal reflux disease (GERD). Having certain medical conditions. These include: Asthma. Alzheimer's disease. Stroke. Chronic pain. An overactive thyroid gland (hyperthyroidism). Other sleep disorders, such as restless legs syndrome and sleep apnea. Menopause. Sometimes, the cause of insomnia may not be known. What increases the risk? Risk factors for insomnia include: Gender. Females are affected more often than males. Age. Insomnia is more common as people get older. Stress and certain medical and mental health conditions. Lack of exercise. Having an irregular work schedule. This may include working night shifts and traveling between different time zones. What are the signs or symptoms? If you have insomnia, the main symptom is having trouble falling asleep or having trouble staying asleep. This may lead to other symptoms, such as: Feeling tired or having low energy. Feeling nervous about going to sleep. Not feeling rested in the morning. Having trouble concentrating. Feeling irritable, anxious, or depressed. How is this diagnosed? This condition may be diagnosed based on: Your symptoms and medical history. Your health care provider may ask about: Your sleep habits. Any medical conditions you have. Your mental health. A physical exam. How is this treated? Treatment for insomnia depends on the cause. Treatment may focus on treating an underlying condition that is causing the insomnia. Treatment may also include: Medicines to help you sleep. Counseling or therapy. Lifestyle adjustments to help you sleep better. Follow these instructions at home: Eating and drinking  Limit or  avoid alcohol, caffeinated beverages, and products that contain nicotine and tobacco, especially close to bedtime. These can disrupt your sleep. Do not eat a large meal or eat spicy foods right before bedtime. This can lead to digestive discomfort that can make it hard for you to sleep. Sleep  habits  Keep a sleep diary to help you and your health care provider figure out what could be causing your insomnia. Write down: When you sleep. When you wake up during the night. How well you sleep and how rested you feel the next day. Any side effects of medicines you are taking. What you eat and drink. Make your bedroom a dark, comfortable place where it is easy to fall asleep. Put up shades or blackout curtains to block light from outside. Use a white noise machine to block noise. Keep the temperature cool. Limit screen use before bedtime. This includes: Not watching TV. Not using your smartphone, tablet, or computer. Stick to a routine that includes going to bed and waking up at the same times every day and night. This can help you fall asleep faster. Consider making a quiet activity, such as reading, part of your nighttime routine. Try to avoid taking naps during the day so that you sleep better at night. Get out of bed if you are still awake after 15 minutes of trying to sleep. Keep the lights down, but try reading or doing a quiet activity. When you feel sleepy, go back to bed. General instructions Take over-the-counter and prescription medicines only as told by your health care provider. Exercise regularly as told by your health care provider. However, avoid exercising in the hours right before bedtime. Use relaxation techniques to manage stress. Ask your health care provider to suggest some techniques that may work well for you. These may include: Breathing exercises. Routines to release muscle tension. Visualizing peaceful scenes. Make sure that you drive carefully. Do not drive if you feel very sleepy. Keep all follow-up visits. This is important. Contact a health care provider if: You are tired throughout the day. You have trouble in your daily routine due to sleepiness. You continue to have sleep problems, or your sleep problems get worse. Get help right away if: You  have thoughts about hurting yourself or someone else. Get help right away if you feel like you may hurt yourself or others, or have thoughts about taking your own life. Go to your nearest emergency room or: Call 911. Call the National Suicide Prevention Lifeline at (347)431-2241 or 988. This is open 24 hours a day. Text the Crisis Text Line at 575-237-1003. Summary Insomnia is a sleep disorder that makes it difficult to fall asleep or stay asleep. Insomnia can be long-term (chronic) or short-term (acute). Treatment for insomnia depends on the cause. Treatment may focus on treating an underlying condition that is causing the insomnia. Keep a sleep diary to help you and your health care provider figure out what could be causing your insomnia. This information is not intended to replace advice given to you by your health care provider. Make sure you discuss any questions you have with your health care provider. Document Revised: 02/15/2021 Document Reviewed: 02/15/2021 Elsevier Patient Education  2024 Elsevier Inc.  Healthy Living: Sleep In this video, you will learn why sleep is an important part of a healthy lifestyle. To view the content, go to this web address: https://pe.elsevier.com/[email or web address]  This video will expire on: 03/01/2025. If you need access to this video following this date, please  reach out to the healthcare provider who assigned it to you. This information is not intended to replace advice given to you by your health care provider. Make sure you discuss any questions you have with your health care provider. Elsevier Patient Education  2024 [employer].

## 2023-05-22 ENCOUNTER — Other Ambulatory Visit (HOSPITAL_BASED_OUTPATIENT_CLINIC_OR_DEPARTMENT_OTHER): Payer: Self-pay | Admitting: Family Medicine

## 2023-05-22 NOTE — Telephone Encounter (Signed)
 Patient is calling regarding his medication: atorvastatin (LIPITOR) 20 MG tablet  Patient states that he spoke with the pharmacy and they advised it is not on his med list. Patient states that another provider had prescribed this medication but when he started seeing Jerre Simon she advised that she would fill the medication of the patient. Patient has one pill left for tonight and one pill for tomorrow.     [facility] [id number] [place], [place] - [address] Phone: [phone]  Fax: [phone]

## 2023-05-23 ENCOUNTER — Telehealth: Payer: Self-pay | Admitting: Neurology

## 2023-05-23 NOTE — Telephone Encounter (Signed)
 HST [facility] pending

## 2023-05-25 NOTE — Telephone Encounter (Signed)
 H[facility] no auth req via fax

## 2023-06-01 NOTE — Telephone Encounter (Signed)
 HST [facility] no auth req via fax   Patient is scheduled at [facility] for 06/13/23 at 10 AM   Mailed packet to the patient.

## 2023-06-13 ENCOUNTER — Ambulatory Visit: Admitting: Neurology

## 2023-06-13 DIAGNOSIS — G4733 Obstructive sleep apnea (adult) (pediatric): Secondary | ICD-10-CM

## 2023-06-13 DIAGNOSIS — Z9189 Other specified personal risk factors, not elsewhere classified: Secondary | ICD-10-CM

## 2023-06-13 DIAGNOSIS — D751 Secondary polycythemia: Secondary | ICD-10-CM

## 2023-06-13 DIAGNOSIS — K219 Gastro-esophageal reflux disease without esophagitis: Secondary | ICD-10-CM

## 2023-06-13 DIAGNOSIS — F518 Other sleep disorders not due to a substance or known physiological condition: Secondary | ICD-10-CM

## 2023-06-13 DIAGNOSIS — J342 Deviated nasal septum: Secondary | ICD-10-CM

## 2023-06-14 NOTE — Progress Notes (Signed)
 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 He presents with polycythemia vera, and his cardiologist ( Dr. Servando Salina) .  has seen ENT ( Chronic ethmoidal sinusitis (Primary Dx);  Seasonal allergic rhinitis due to pollen;  Deviated nasal septum;  Orthostatic dizziness;  Hypertension, unspecified type;  Sleep-disordered breathing )  and GI , dx with laryngeal reflux. GERD.    Chief concern according to patient :  " I have variable times for sleep and meals, no routines, not ever sleeping longer than 4 hours. Loud snoring,  nasal and sinus congstion, Acid reflux)  and I can't go to sleep, my mind is racing.  I am heavier.  All physician have  wanted him to have a sleep study.    The patient never had a sleep study, Sleep relevant medical history: Nocturia 1-2 times ,  Vivid dreams, no past ENT surgery but known deviated septum , chronic sinus, Adenoid hypertrophy, Chronic rhinitis, no TBI no neck injuries  Polycythemia vera. Family medical /sleep history: No  other family member with OSA     Epworth sleepiness score:  13/ 24 points . FSS endorsed at 24/ 63 points.      BMI: 37.5 kg/m   Neck Circumference: 18. 25    FINDINGS:   Sleep Summary:   Total Recording Time (hours, min): 5 hours 28 minutes      Total Sleep Time (hours, min):    4 hours 53 minutes             Percent REM (%): 29%  Calculated sleep latency was 22 minutes and calculated REM sleep latency 113 minutes.  There were 13 minutes of wakefulness after sleep onset.                                      Respiratory Indices:   Calculated pAHI (per  AASM or CMS guideline):[name] Kitchen  Following AASM guideline the total AHI was 13.3/h, and there were no central events present.                      REM pAHI:   The REM sleep AHI was 21.3/h                                              NREM pAHI:    The non-REM sleep AHI was 10.4/h.                          Positional AHI:   The patient slept exclusively in non-REM supine positions 207 minutes on his right with an AHI of 14.8/h and 82 minutes prone with an AHI of 8.1/h Snoring:    Reached a mean volume of 40 dB which would be just the threshold of detection.  There were only 8% of the total sleep time  measured to be associated with snoring.                                            Oxygen Saturation Statistics:   Oxygen Saturation (%) Mean:    Mean oxygen saturation was 93%            O2 Saturation Range (%):   Between the nadir at 82 and a maximum of 98% with a total desaturation time of less than 1 minute.                                         Pulse Rate Statistics:   Pulse Mean (bpm):   64 bpm              Pulse Range:   Between 54 and 109 bpm.              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  The patient avoided the supine sleep position which may have exacerbated the AHI overall.  I will recommend positive airway pressure therapy for him with an auto titration device set between 5 and 15 cm water pressure with 3 cm EPR, heated humidification and with a mask of his choice.        RECOMMENDATION: I will recommend positive airway pressure therapy for him with an auto titration device set between 5 and 15 cm water pressure with 3 cm EPR, heated humidification and with a mask of his choice.  Equally important is to develop a medically guided weight loss program which should also include exercise,diet and possibly medications.  The diagnosis of moderate sleep apnea would allow some recently FDA approved weight loss  medications to be more accessible to patient's visit underlying diagnosis for weight loss.  The patient can benefit from sleeping on an elevated surface, this also helps with acid reflux.  I recommended a nasal spray to keep the nasal airway open.  Insomnia is likely related to sleep hygiene and sleep habits and it is hard to work regular hours and still find a routine sleep schedule.     INTERPRETING PHYSICIAN:   Melvyn Novas, MD  [facility] Sleep Board certified by The [employer] of Sleep Medicine and Diplomate of the [employer] of Sleep Medicine. Board certified In Neurology through the ABPN, Fellow of the [employer] of Neurology.

## 2023-06-18 ENCOUNTER — Encounter: Payer: Self-pay | Admitting: Neurology

## 2023-06-18 NOTE — Addendum Note (Signed)
 Addended by: Melvyn Novas on: 06/18/2023 07:42 PM   Modules accepted: Orders

## 2023-06-18 NOTE — Procedures (Signed)
 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 He presents with polycythemia vera, and his cardiologist ( Dr. Servando Salina) .  has seen ENT ( Chronic ethmoidal sinusitis (Primary Dx);  Seasonal allergic rhinitis due to pollen;  Deviated nasal septum;  Orthostatic dizziness;  Hypertension, unspecified type;  Sleep-disordered breathing )  and GI , dx with laryngeal reflux. GERD.    Chief concern according to patient :  " I have variable times for sleep and meals, no routines, not ever sleeping longer than 4 hours. Loud snoring,  nasal and sinus congstion, Acid reflux)  and I can't go to sleep, my mind is racing.  I am heavier.  All physician have  wanted him to have a sleep study.    The patient never had a sleep study, Sleep relevant medical history: Nocturia 1-2 times ,  Vivid dreams, no past ENT surgery but known deviated septum , chronic sinus, Adenoid hypertrophy, Chronic rhinitis, no TBI no neck injuries  Polycythemia vera. Family medical /sleep history: No  other family member with OSA     Epworth sleepiness score:  13/ 24 points . FSS endorsed at 24/ 63 points.      BMI: 37.5 kg/m   Neck Circumference: 18. 25    FINDINGS:   Sleep Summary:   Total Recording Time (hours, min): 5 hours 28 minutes      Total Sleep Time (hours, min):    4 hours 53 minutes             Percent REM (%): 29%  Calculated sleep latency was 22 minutes and calculated REM sleep latency 113 minutes.  There were 13 minutes of wakefulness after sleep onset.                                      Respiratory Indices:   Calculated pAHI (per  AASM or CMS guideline):[name] Kitchen  Following AASM guideline the total AHI was 13.3/h, and there were no central events present.                      REM pAHI:   The REM sleep AHI was 21.3/h                                              NREM pAHI:    The non-REM sleep AHI was 10.4/h.                          Positional AHI:   The patient slept exclusively in non-REM supine positions 207 minutes on his right with an AHI of 14.8/h and 82 minutes prone with an AHI of 8.1/h Snoring:    Reached a mean volume of 40 dB which would be just the threshold of detection.  There were only 8% of the total sleep time  measured to be associated with snoring.                                            Oxygen Saturation Statistics:   Oxygen Saturation (%) Mean:    Mean oxygen saturation was 93%            O2 Saturation Range (%):   Between the nadir at 82 and a maximum of 98% with a total desaturation time of less than 1 minute.                                         Pulse Rate Statistics:   Pulse Mean (bpm):   64 bpm              Pulse Range:   Between 54 and 109 bpm.              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  The patient avoided the supine sleep position which may have exacerbated the AHI overall.  I will recommend positive airway pressure therapy for him with an auto titration device set between 5 and 15 cm water pressure with 3 cm EPR, heated humidification and with a mask of his choice.        RECOMMENDATION: I will recommend positive airway pressure therapy for him with an auto titration device set between 5 and 15 cm water pressure with 3 cm EPR, heated humidification and with a mask of his choice.  Equally important is to develop a medically guided weight loss program which should also include exercise,diet and possibly medications.  The diagnosis of moderate sleep apnea would allow some recently FDA approved weight loss  medications to be more accessible to patient's visit underlying diagnosis for weight loss.  The patient can benefit from sleeping on an elevated surface, this also helps with acid reflux.  I recommended a nasal spray to keep the nasal airway open.  Insomnia is likely related to sleep hygiene and sleep habits and it is hard to work regular hours and still find a routine sleep schedule.     INTERPRETING PHYSICIAN:   Melvyn Novas, MD  [facility] Sleep Board certified by The [employer] of Sleep Medicine and Diplomate of the [employer] of Sleep Medicine. Board certified In Neurology through the ABPN, Fellow of the [employer] of Neurology.

## 2023-06-19 ENCOUNTER — Encounter (HOSPITAL_BASED_OUTPATIENT_CLINIC_OR_DEPARTMENT_OTHER): Payer: Self-pay | Admitting: Family Medicine

## 2023-06-19 ENCOUNTER — Telehealth: Payer: Self-pay

## 2023-06-19 NOTE — Telephone Encounter (Signed)
 I called pt. I advised pt that Dr. Vickey Huger reviewed their sleep study results and found that pt mild OSA. Dr. Vickey Huger recommends that pt use an CPAP. I reviewed PAP compliance expectations with the pt. Pt is agreeable to starting a CPAP. I advised pt that an order will be sent to a DME, [facility], and [facility] will call the pt within about one week after they file with the pt's insurance. [facility] will show the pt how to use the machine, fit for masks, and troubleshoot the CPAP if needed. A follow up appt was made for insurance purposes with Dr. Vickey Huger on CPAP. Pt verbalized understanding to arrive 15 minutes early and bring their CPAP. Pt verbalized understanding of results. Pt had no questions at this time but was encouraged to call back if questions arise. I have sent the order to [facility] and have received confirmation that they have received the order.

## 2023-06-19 NOTE — Telephone Encounter (Signed)
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  The patient avoided the supine sleep position which may have exacerbated the AHI overall.  I will recommend positive airway pressure therapy for him with an auto titration device set between 5 and 15 cm water pressure with 3 cm EPR, heated humidification and with a mask of his choice.           RECOMMENDATION: I will recommend positive airway pressure therapy for him with an auto titration device set between 5 and 15 cm water pressure with 3 cm EPR, heated humidification and with a mask of his choice.  Equally important is to develop a medically guided weight loss program which should also include exercise,diet and possibly medications.  The diagnosis of moderate sleep apnea would allow some recently FDA approved weight loss medications to be more accessible to patient's visit underlying diagnosis for weight loss.   The patient can benefit from sleeping on an elevated surface, this also helps with acid reflux.  I recommended a nasal spray to keep the nasal airway open.

## 2023-06-28 ENCOUNTER — Encounter (HOSPITAL_BASED_OUTPATIENT_CLINIC_OR_DEPARTMENT_OTHER): Payer: Self-pay | Admitting: Family Medicine

## 2023-06-28 ENCOUNTER — Telehealth (HOSPITAL_BASED_OUTPATIENT_CLINIC_OR_DEPARTMENT_OTHER): Admitting: Family Medicine

## 2023-06-28 VITALS — Ht 66.0 in | Wt 230.0 lb

## 2023-06-28 DIAGNOSIS — G4733 Obstructive sleep apnea (adult) (pediatric): Secondary | ICD-10-CM | POA: Diagnosis not present

## 2023-06-28 DIAGNOSIS — E66812 Obesity, class 2: Secondary | ICD-10-CM | POA: Diagnosis not present

## 2023-06-28 NOTE — Patient Instructions (Signed)
 [name] Kitchen

## 2023-06-28 NOTE — Progress Notes (Signed)
 Virtual Video Visit  I connected with Cory Frazier on 06/28/23 at  3:10 PM EDT by a video enabled telemedicine application and verified that I am speaking with the correct person using two identifiers.   Location patient: Home Location provider: Clinic Persons participating in the virtual visit: Patient; Cristy Hilts CMA; Jerre Simon, FNP-C  I discussed the limitations of evaluation and management by telemedicine and the availability of in-person appointments. The patient expressed understanding and agreed to proceed.  Chief Complaint  Patient presents with   Medical Management of Chronic Issues    Spoke with pt who wants to discuss sleep apnea and weight loss.    SUBJECTIVE:  HPI:  Cory Frazier is a 48 yo male with history of obesity, polycythemia, HLD, and HTN presenting to discuss new diagnosis of sleep apnea. Patient was referred for workup of polycythemia with possible OSA by PCP. Patient was seen by [facility] and completed sleep study which indicated patient has moderate obstructive sleep apnea.  He was recommended to start on CPAP and is currently in the process of obtaining his mask and supplies from the DME company per neurology.  He was also recommended to discuss possible weight loss options given diagnosis of sleep apnea with his PCP.  Patient has been making significant dietary changes, exercise to help with weight loss organically.  He does not have a history of diabetes.  Last CMP was performed in January 2025 and is unremarkable.  Patient has no history of pancreatitis, drinks alcohol socially, denies history or family history of medullary thyroid cancer or multiple endocrine neoplasia syndrome.   The following portions of the patient's history were reviewed and updated as appropriate: medical history, surgical history, medications, allergies, social history, and family history.    Past Medical History:  Diagnosis Date   Anxiety    Asthma     Hyperlipidemia    Obesity    Palpitations    Polycythemia    Vitamin D deficiency    History reviewed. No pertinent surgical history.   Current Outpatient Medications:    aspirin 81 MG chewable tablet, Chew 81 mg by mouth daily. Takes rarely, Disp: , Rfl:    atorvastatin (LIPITOR) 20 MG tablet, TAKE 1 TABLET BY MOUTH DAILY, Disp: 90 tablet, Rfl: 3   propranolol (INDERAL) 20 MG tablet, Take 1/2 tablet ( 10 mg ) twice a day, Disp: , Rfl:    tadalafil (CIALIS) 10 MG tablet, Take 1 tablet (10 mg total) by mouth every other day as needed for erectile dysfunction. Do not take more than once daily., Disp: 10 tablet, Rfl: 1   valsartan (DIOVAN) 80 MG tablet, Take 1 tablet (80 mg total) by mouth 2 (two) times daily., Disp: 180 tablet, Rfl: 3   hydrochlorothiazide (MICROZIDE) 12.5 MG capsule, Take 1 capsule (12.5 mg total) by mouth 2 (two) times daily., Disp: 180 capsule, Rfl: 1 Allergies  Allergen Reactions   Codeine Other (See Comments)    unknown unknown   Claritin [Loratadine] Anxiety    Social History   Socioeconomic History   Marital status: Single    Spouse name: Not on file   Number of children: Not on file   Years of education: Not on file   Highest education level: Not on file  Occupational History   Not on file  Tobacco Use   Smoking status: Never    Passive exposure: Never   Smokeless tobacco: Never  Vaping Use   Vaping status: Never Used  Substance and Sexual Activity   Alcohol use: Yes    Comment: Occasional    Drug use: No   Sexual activity: Not Currently  Other Topics Concern   Not on file  Social History Narrative   Not on file   Social Drivers of Health   Financial Resource Strain: Patient Declined (05/01/2023)   Overall Financial Resource Strain (CARDIA)    Difficulty of Paying Living Expenses: Patient declined  Food Insecurity: No Food Insecurity (05/01/2023)   Hunger Vital Sign    Worried About Running Out of Food in the Last Year: Never true    Ran  Out of Food in the Last Year: Never true  Transportation Needs: No Transportation Needs (05/01/2023)   PRAPARE - Administrator, Civil Service (Medical): No    Lack of Transportation (Non-Medical): No  Physical Activity: Inactive (05/01/2023)   Exercise Vital Sign    Days of Exercise per Week: 0 days    Minutes of Exercise per Session: 0 min  Stress: Stress Concern Present (05/01/2023)   [name]    Feeling of Stress : Rather much  Social Connections: Socially Isolated (05/01/2023)   Social Connection and Isolation Panel [NHANES]    Frequency of Communication with Friends and Family: More than three times a week    Frequency of Social Gatherings with Friends and Family: Once a week    Attends Religious Services: Never    Database administrator or Organizations: No    Attends Banker Meetings: Never    Marital Status: Never married  Intimate Partner Violence: Not At Risk (05/01/2023)   Humiliation, Afraid, Rape, and Kick questionnaire    Fear of Current or Ex-Partner: No    Emotionally Abused: No    Physically Abused: No    Sexually Abused: No    Family History  Problem Relation Age of Onset   Colon cancer Mother    Heart disease Father      ROS: A complete ROS was performed with pertinent positives/negatives noted in the HPI. The remainder of the ROS are negative.    OBJECTIVE:  VITALS per patient if applicable: Today's Vitals   06/28/23 1507  Weight: 230 lb (104.3 kg)  Height: 5\' 6"  (1.676 m)   Body mass index is 37.12 kg/m.   GENERAL: Alert and oriented. Appears well and in no acute distress. HEENT: Atraumatic. Conjunctiva clear. No obvious abnormalities on inspection of external nose and ears. NECK: Normal movements of the head and neck. LUNGS: On inspection, no signs of respiratory distress. Breathing rate appears normal. No obvious gross SOB, gasping or wheezing, and no  conversational dyspnea. CV: No obvious cyanosis. MS: Moves all visible extremities without noticeable abnormality. PSYCH/NEURO: Pleasant and cooperative. No obvious depression or anxiety. Speech and thought processing grossly intact.  ASSESSMENT AND PLAN: 1. Moderate obstructive sleep apnea in adult (Primary) We discussed the pathology and possible adverse effects of sleep apnea in depth and patient is agreeable to proceed with use of CPAP per neurology.  He will follow-up with neurology as scheduled.  We also discussed the benefit of a GLP-1 and he is agreeable to try.  He will return for lab work prior to start of medication.  We discussed the possible side effects and adverse effects of medication patient verbalized understanding.  Will send in Zepbound pending lab results. - Hemoglobin A1c; Future  2. Class 2 severe obesity due to excess calories with serious  comorbidity in adult, unspecified BMI (HCC) Discussed healthy lifestyle management, regular exercise, dietary changes in addition to GLP-1 medication that will help with weight loss and hopeful reduction of OSA.  Patient verbalized understanding.  Will recommend follow-up in 3 months.     I discussed the assessment and treatment plan with the patient. The patient was provided an opportunity to ask questions and all were answered. The patient agreed with the plan and demonstrated an understanding of the instructions.   The patient was advised to call back or seek an in-person evaluation if the symptoms worsen or if the condition fails to improve as anticipated.  I provided 12 minutes of non-face-to-face time during this encounter.  Return in about 3 months (around 09/27/2023) for Follow up Weight Management/OSA.  Hilbert Bible, [place]

## 2023-06-29 ENCOUNTER — Other Ambulatory Visit (HOSPITAL_BASED_OUTPATIENT_CLINIC_OR_DEPARTMENT_OTHER): Payer: Self-pay | Admitting: Family Medicine

## 2023-06-29 DIAGNOSIS — G4733 Obstructive sleep apnea (adult) (pediatric): Secondary | ICD-10-CM | POA: Diagnosis not present

## 2023-06-29 DIAGNOSIS — Z131 Encounter for screening for diabetes mellitus: Secondary | ICD-10-CM | POA: Diagnosis not present

## 2023-06-29 LAB — HEMOGLOBIN A1C
Est. average glucose Bld gHb Est-mCnc: 108 mg/dL
Hgb A1c MFr Bld: 5.4 % (ref 4.8–5.6)

## 2023-07-03 ENCOUNTER — Other Ambulatory Visit (HOSPITAL_BASED_OUTPATIENT_CLINIC_OR_DEPARTMENT_OTHER): Payer: Self-pay | Admitting: Family Medicine

## 2023-07-03 ENCOUNTER — Encounter (HOSPITAL_BASED_OUTPATIENT_CLINIC_OR_DEPARTMENT_OTHER): Payer: Self-pay | Admitting: Family Medicine

## 2023-07-03 DIAGNOSIS — G4733 Obstructive sleep apnea (adult) (pediatric): Secondary | ICD-10-CM

## 2023-07-03 MED ORDER — TIRZEPATIDE-WEIGHT MANAGEMENT 2.5 MG/0.5ML ~~LOC~~ SOLN: 2.5000 mg | SUBCUTANEOUS | 1 refills | Status: DC

## 2023-07-03 NOTE — Progress Notes (Signed)
 Cory Frazier, can you work on the [place]. The main diagnosis for coverage should be sleep apnea but we can also use obesity.   Hi [name],  Your A1C is normal indicating no sign of prediabetes or diabetes. I have sent in the Zepbound injectable medication to help with weight loss for correcting and improving your obstructive sleep apnea. A prior authorization will be needed and our team will begin working on this this week. You should be notified when it is complete by your pharmacy. If you have any questions about how ot use the medication or a demonstration on how to inject, please let us  know.

## 2023-07-04 ENCOUNTER — Telehealth (HOSPITAL_BASED_OUTPATIENT_CLINIC_OR_DEPARTMENT_OTHER): Admitting: Family Medicine

## 2023-07-06 ENCOUNTER — Other Ambulatory Visit (HOSPITAL_BASED_OUTPATIENT_CLINIC_OR_DEPARTMENT_OTHER): Payer: Self-pay | Admitting: Family Medicine

## 2023-07-06 ENCOUNTER — Encounter (HOSPITAL_BASED_OUTPATIENT_CLINIC_OR_DEPARTMENT_OTHER): Payer: Self-pay | Admitting: Family Medicine

## 2023-07-06 NOTE — Progress Notes (Signed)
 PCP resubmitted prior authorization under OSA diagnosis for Zepbound use to [facility]. Pending reponse from insurance at this time.

## 2023-07-10 ENCOUNTER — Other Ambulatory Visit: Payer: Self-pay | Admitting: Cardiology

## 2023-07-10 ENCOUNTER — Other Ambulatory Visit (HOSPITAL_BASED_OUTPATIENT_CLINIC_OR_DEPARTMENT_OTHER): Payer: Self-pay | Admitting: Family Medicine

## 2023-07-10 DIAGNOSIS — G4733 Obstructive sleep apnea (adult) (pediatric): Secondary | ICD-10-CM

## 2023-07-10 DIAGNOSIS — E66812 Obesity, class 2: Secondary | ICD-10-CM

## 2023-07-10 MED ORDER — TIRZEPATIDE-WEIGHT MANAGEMENT 2.5 MG/0.5ML ~~LOC~~ SOLN: 2.5000 mg | SUBCUTANEOUS | 1 refills | Status: DC

## 2023-07-10 NOTE — Progress Notes (Signed)
 Zepbound resubmitted for OSA only diagnosis with included [facility] Sleep Study results and PCP note.

## 2023-07-11 ENCOUNTER — Telehealth (HOSPITAL_BASED_OUTPATIENT_CLINIC_OR_DEPARTMENT_OTHER): Payer: Self-pay | Admitting: Family Medicine

## 2023-07-11 MED ORDER — PROPRANOLOL HCL 20 MG PO TABS: 20.0000 mg | ORAL_TABLET | Freq: Two times a day (BID) | ORAL | 3 refills | Status: DC

## 2023-07-11 NOTE — Telephone Encounter (Signed)
 Fax refill request received for pt's propranolol  20mg . Looks like the last time this was filled, it was filled by cardiology. Alexis, please advise if you are okay taking over refills of this medication or if pt needs to continue to get refills for this from cardiology.

## 2023-07-11 NOTE — Telephone Encounter (Signed)
 Medication has been refilled.

## 2023-07-12 ENCOUNTER — Other Ambulatory Visit (HOSPITAL_BASED_OUTPATIENT_CLINIC_OR_DEPARTMENT_OTHER): Payer: Self-pay | Admitting: *Deleted

## 2023-07-12 MED ORDER — PROPRANOLOL HCL 20 MG PO TABS: 10.0000 mg | ORAL_TABLET | Freq: Two times a day (BID) | ORAL | 3 refills | Status: DC

## 2023-07-13 ENCOUNTER — Telehealth (HOSPITAL_BASED_OUTPATIENT_CLINIC_OR_DEPARTMENT_OTHER): Payer: Self-pay | Admitting: *Deleted

## 2023-07-13 NOTE — Telephone Encounter (Signed)
 Prior Authorization submitted for zepbound. Per prior authorization denial, they were unable to approve this drug. Will resubmit prior auth under weight management to see if this can be covered

## 2023-07-14 ENCOUNTER — Ambulatory Visit: Payer: Medicaid Other | Admitting: Gastroenterology

## 2023-07-14 NOTE — Telephone Encounter (Signed)
 New mychart message sent by pt  Cory Frazier  P [facility] (supporting [facility] [place], FNP)29 minutes ago (9:44 AM)    Hi. The pharmacy contacted me and told me that it was denied again. Is there a way someone from the office could give me a call?     Trula Gable, please see new mychart message sent by pt and advise.

## 2023-07-18 ENCOUNTER — Encounter (HOSPITAL_BASED_OUTPATIENT_CLINIC_OR_DEPARTMENT_OTHER): Payer: Self-pay | Admitting: Family Medicine

## 2023-07-18 ENCOUNTER — Other Ambulatory Visit (HOSPITAL_BASED_OUTPATIENT_CLINIC_OR_DEPARTMENT_OTHER): Payer: Self-pay | Admitting: Family Medicine

## 2023-07-18 DIAGNOSIS — G4733 Obstructive sleep apnea (adult) (pediatric): Secondary | ICD-10-CM

## 2023-07-18 MED ORDER — SEMAGLUTIDE-WEIGHT MANAGEMENT 0.25 MG/0.5ML ~~LOC~~ SOAJ: 0.2500 mg | SUBCUTANEOUS | 0 refills | Status: DC

## 2023-07-18 NOTE — Telephone Encounter (Signed)
 Prior authorization was denied due to pt's baseline weight and BMI from last OV being more than 45 days from the time the prior auth was initiated.  Have sent pt a mychart message about this to see if we can get him scheduled for an appt as after we have this information, the prior auth should be approved.

## 2023-07-18 NOTE — Telephone Encounter (Signed)
 Spoke with [employer]. Zepbound was denied as patient must try wegovy first if wegovy fails to help him (must try for 3-6 months) then can try zepbound

## 2023-07-18 NOTE — Telephone Encounter (Signed)
 Prior authorization initiated 4/29. Waiting for response.

## 2023-07-18 NOTE — Telephone Encounter (Signed)
 Overlooked that pt had a video visit 06/28/23. Called peer-to-peer phone number [phone] and was able to reinitiate the prior auth with providing pt's weight and BMI from 06/28/22.  Reference number: [id number]  Told it could be about a 24 hour turn around time. Pt was updated on this.

## 2023-07-19 NOTE — Telephone Encounter (Signed)
 Received a fax stating that the Cory Frazier has been approved for 07/18/23-01/14/24. Sent pt a mychart message making him aware of this.

## 2023-07-24 ENCOUNTER — Other Ambulatory Visit: Payer: Self-pay | Admitting: Cardiology

## 2023-07-25 DIAGNOSIS — G4733 Obstructive sleep apnea (adult) (pediatric): Secondary | ICD-10-CM | POA: Diagnosis not present

## 2023-07-26 ENCOUNTER — Other Ambulatory Visit (HOSPITAL_BASED_OUTPATIENT_CLINIC_OR_DEPARTMENT_OTHER): Payer: Self-pay | Admitting: *Deleted

## 2023-07-26 MED ORDER — HYDROCHLOROTHIAZIDE 12.5 MG PO CAPS: 12.5000 mg | ORAL_CAPSULE | Freq: Two times a day (BID) | ORAL | 1 refills | Status: DC

## 2023-08-18 ENCOUNTER — Other Ambulatory Visit (HOSPITAL_BASED_OUTPATIENT_CLINIC_OR_DEPARTMENT_OTHER): Payer: Self-pay | Admitting: Family Medicine

## 2023-08-18 ENCOUNTER — Encounter (HOSPITAL_BASED_OUTPATIENT_CLINIC_OR_DEPARTMENT_OTHER): Payer: Self-pay | Admitting: Family Medicine

## 2023-08-18 DIAGNOSIS — G4733 Obstructive sleep apnea (adult) (pediatric): Secondary | ICD-10-CM

## 2023-08-18 MED ORDER — SEMAGLUTIDE-WEIGHT MANAGEMENT 0.5 MG/0.5ML ~~LOC~~ SOAJ: 0.5000 mg | SUBCUTANEOUS | 4 refills | Status: DC

## 2023-08-18 NOTE — Telephone Encounter (Signed)
 Cory Frazier, please advise if dose needs to stay the same or if it is to be upped.

## 2023-08-25 ENCOUNTER — Other Ambulatory Visit (HOSPITAL_BASED_OUTPATIENT_CLINIC_OR_DEPARTMENT_OTHER): Payer: Self-pay | Admitting: Family Medicine

## 2023-08-25 DIAGNOSIS — G4733 Obstructive sleep apnea (adult) (pediatric): Secondary | ICD-10-CM

## 2023-08-25 DIAGNOSIS — E66812 Obesity, class 2: Secondary | ICD-10-CM

## 2023-08-25 MED ORDER — SEMAGLUTIDE-WEIGHT MANAGEMENT 0.25 MG/0.5ML ~~LOC~~ SOAJ
0.2500 mg | SUBCUTANEOUS | 0 refills | Status: DC
Start: 2023-08-25 — End: 2023-09-21

## 2023-09-12 ENCOUNTER — Ambulatory Visit: Admitting: Gastroenterology

## 2023-09-21 ENCOUNTER — Encounter (HOSPITAL_BASED_OUTPATIENT_CLINIC_OR_DEPARTMENT_OTHER): Payer: Self-pay | Admitting: Family Medicine

## 2023-09-21 ENCOUNTER — Other Ambulatory Visit (HOSPITAL_BASED_OUTPATIENT_CLINIC_OR_DEPARTMENT_OTHER): Payer: Self-pay | Admitting: Family Medicine

## 2023-09-21 DIAGNOSIS — G4733 Obstructive sleep apnea (adult) (pediatric): Secondary | ICD-10-CM

## 2023-09-21 DIAGNOSIS — E66812 Obesity, class 2: Secondary | ICD-10-CM

## 2023-09-21 MED ORDER — SEMAGLUTIDE-WEIGHT MANAGEMENT 1 MG/0.5ML ~~LOC~~ SOAJ: 1.0000 mg | SUBCUTANEOUS | 3 refills | Status: DC

## 2023-09-21 NOTE — Telephone Encounter (Signed)
 Please see mychart message sent by pt and advise.

## 2023-09-27 ENCOUNTER — Encounter (HOSPITAL_BASED_OUTPATIENT_CLINIC_OR_DEPARTMENT_OTHER): Payer: Self-pay | Admitting: Family Medicine

## 2023-09-27 ENCOUNTER — Ambulatory Visit (HOSPITAL_BASED_OUTPATIENT_CLINIC_OR_DEPARTMENT_OTHER): Admitting: Family Medicine

## 2023-09-27 VITALS — BP 135/88 | HR 82 | Ht 66.0 in | Wt 219.5 lb

## 2023-09-27 DIAGNOSIS — E66812 Obesity, class 2: Secondary | ICD-10-CM | POA: Diagnosis not present

## 2023-09-27 DIAGNOSIS — G4733 Obstructive sleep apnea (adult) (pediatric): Secondary | ICD-10-CM | POA: Diagnosis not present

## 2023-09-27 NOTE — Patient Instructions (Signed)
 Please reach out to [facility] regarding an oral appliance for OSA.

## 2023-09-27 NOTE — Progress Notes (Signed)
 Subjective:   Cory Frazier 1975-06-14 09/27/2023  Chief Complaint  Patient presents with   Medical Management of Chronic Issues    65-month follow up; states he does not think the Wegovy  is working for him. Does get occasional stomach pains.    HPI: Cory Frazier presents today for re-assessment and management of chronic medical conditions.  WEIGHT MANAGEMENT: Cory Frazier presents for weight management due to OSA. He did try a CPAP machine with different masks and was unable to tolerate the pressure and masks. He would like to try an oral appliance. He feels the wegovy  is not helping with appetite suppression or weight loss. Dose was increased to 1mg /weekly on 09/21/2023.  He feels he lost weight prior to starting and has not noticed a difference.  Adhering to healthy diet:No; is only eating one meal a day and snacks. He states he has not made dietary changes.  Current dietary plan: None;  Regular exercise regimen: Walking regularly   Wt Readings from Last 3 Encounters:  09/27/23 219 lb 8 oz (99.6 kg)  06/28/23 230 lb (104.3 kg)  05/17/23 232 lb (105.2 kg)      The following portions of the patient's history were reviewed and updated as appropriate: past medical history, past surgical history, family history, social history, allergies, medications, and problem list.   Patient Active Problem List   Diagnosis Date Noted   Moderate obstructive sleep apnea in adult 06/28/2023   Class 2 severe obesity due to excess calories with serious comorbidity in adult [facility]) 06/28/2023   Short sleeper 05/17/2023   Snoring 05/17/2023   At risk for obstructive sleep apnea 05/17/2023   GERD with apnea 02/22/2023   Polycythemia 10/11/2022   Pain of left anterior lower extremity 10/11/2022   Sebaceous cyst 06/30/2022   Chronic ethmoidal sinusitis 05/23/2022   Deviated nasal septum 05/23/2022   Hypertension 05/23/2022   Orthostatic dizziness 05/23/2022   Sleep-disordered breathing  05/23/2022   GAD (generalized anxiety disorder) 03/29/2022   Panic anxiety syndrome 03/29/2022   Hyperlipidemia 03/01/2022   Vitamin D  deficiency 06/30/2020   Past Medical History:  Diagnosis Date   Anxiety    Asthma    Hyperlipidemia    Obesity    Palpitations    Polycythemia    Vitamin D  deficiency    History reviewed. No pertinent surgical history. Family History  Problem Relation Age of Onset   Colon cancer Mother    Heart disease Father    Outpatient Medications Prior to Visit  Medication Sig Dispense Refill   aspirin 81 MG chewable tablet Chew 81 mg by mouth daily. Takes rarely     atorvastatin  (LIPITOR) 20 MG tablet TAKE 1 TABLET BY MOUTH DAILY 90 tablet 3   hydrochlorothiazide  (MICROZIDE ) 12.5 MG capsule Take 1 capsule (12.5 mg total) by mouth 2 (two) times daily. 180 capsule 1   propranolol  (INDERAL ) 20 MG tablet Take 0.5 tablets (10 mg total) by mouth 2 (two) times daily. 180 tablet 3   Semaglutide -Weight Management 1 MG/0.5ML SOAJ Inject 1 mg into the skin once a week. 2 mL 3   tadalafil  (CIALIS ) 10 MG tablet Take 1 tablet (10 mg total) by mouth every other day as needed for erectile dysfunction. Do not take more than once daily. 10 tablet 1   valsartan  (DIOVAN ) 80 MG tablet Take 1 tablet (80 mg total) by mouth 2 (two) times daily. 180 tablet 3   No facility-administered medications prior to visit.   Allergies  Allergen  Reactions   Codeine Other (See Comments)    unknown unknown   Claritin [Loratadine] Anxiety     ROS: A complete ROS was performed with pertinent positives/negatives noted in the HPI. The remainder of the ROS are negative.    Objective:   Today's Vitals   09/27/23 1032  BP: 135/88  Pulse: 82  SpO2: 99%  Weight: 219 lb 8 oz (99.6 kg)  Height: 5' 6 (1.676 m)    Physical Exam          GENERAL: Well-appearing, in NAD. Well nourished.  SKIN: Pink, warm and dry.  Head: Normocephalic. NECK: Trachea midline. Full ROM w/o pain or  tenderness.  RESPIRATORY: Chest wall symmetrical. Respirations even and non-labored. Breath sounds clear to auscultation bilaterally.  CARDIAC: S1, S2 present, regular rate and rhythm without murmur or gallops. Peripheral pulses 2+ bilaterally.  MSK: Muscle tone and strength appropriate for age.  NEUROLOGIC: No motor or sensory deficits. Steady, even gait. C2-C12 intact.  PSYCH/MENTAL STATUS: Alert, oriented x 3. Cooperative, appropriate mood and affect.   Health Maintenance Due  Topic Date Due   Hepatitis B Vaccines (1 of 3 - 19+ 3-dose series) Never done   Colonoscopy  Never done   COVID-19 Vaccine (1 - 2024-25 season) Never done    No results found for any visits on 09/27/23.  The 10-year ASCVD risk score (Arnett DK, et al., 2019) is: 3.4%     Assessment & Plan:  1. Class 2 severe obesity due to excess calories with serious comorbidity in adult, unspecified BMI (HCC) (Primary) Discussed needed dietary changes and regular high protein, low carb snacks/meals during the day to improve metabolic activity. Will continue with exercise and incorporate diet and will use Wegovy  1mg . Recommend follow up in 3 months.   2. Moderate obstructive sleep apnea in adult Recommend patient reach out to [facility] to discuss titration of CPAP pressure settings and/or oral appliance fitting.   No orders of the defined types were placed in this encounter.  Lab Orders  No laboratory test(s) ordered today   No images are attached to the encounter or orders placed in the encounter.  Return in about 3 months (around 12/28/2023) for Weight Check Up ; OSA .    Patient to reach out to office if new, worrisome, or unresolved symptoms arise or if no improvement in patient's condition. Patient verbalized understanding and is agreeable to treatment plan. All questions answered to patient's satisfaction.    Thersia Schuyler Stark, [place]

## 2023-10-06 ENCOUNTER — Telehealth: Payer: Self-pay | Admitting: Hematology and Oncology

## 2023-10-11 ENCOUNTER — Encounter (HOSPITAL_BASED_OUTPATIENT_CLINIC_OR_DEPARTMENT_OTHER): Payer: Self-pay | Admitting: Family Medicine

## 2023-10-11 NOTE — Telephone Encounter (Signed)
 Please see mychart message sent by pt and advise.

## 2023-10-12 NOTE — Telephone Encounter (Signed)
Please see new message sent

## 2023-10-13 ENCOUNTER — Ambulatory Visit: Payer: Medicaid Other | Admitting: Hematology and Oncology

## 2023-10-13 ENCOUNTER — Other Ambulatory Visit (HOSPITAL_BASED_OUTPATIENT_CLINIC_OR_DEPARTMENT_OTHER): Payer: Self-pay | Admitting: Family Medicine

## 2023-10-13 ENCOUNTER — Other Ambulatory Visit: Payer: Medicaid Other

## 2023-10-13 DIAGNOSIS — G4733 Obstructive sleep apnea (adult) (pediatric): Secondary | ICD-10-CM

## 2023-10-13 DIAGNOSIS — E66812 Obesity, class 2: Secondary | ICD-10-CM

## 2023-10-13 MED ORDER — TIRZEPATIDE-WEIGHT MANAGEMENT 2.5 MG/0.5ML ~~LOC~~ SOLN: 2.5000 mg | SUBCUTANEOUS | 1 refills | Status: DC

## 2023-10-16 ENCOUNTER — Other Ambulatory Visit (HOSPITAL_BASED_OUTPATIENT_CLINIC_OR_DEPARTMENT_OTHER): Payer: Self-pay | Admitting: Family Medicine

## 2023-10-16 MED ORDER — ZEPBOUND 2.5 MG/0.5ML ~~LOC~~ SOAJ: 2.5000 mg | SUBCUTANEOUS | 1 refills | Status: DC

## 2023-11-03 ENCOUNTER — Telehealth: Payer: Self-pay | Admitting: Hematology and Oncology

## 2023-11-06 ENCOUNTER — Inpatient Hospital Stay

## 2023-11-06 ENCOUNTER — Inpatient Hospital Stay: Admitting: Hematology and Oncology

## 2023-11-09 ENCOUNTER — Ambulatory Visit: Admitting: Gastroenterology

## 2023-11-29 ENCOUNTER — Other Ambulatory Visit (HOSPITAL_BASED_OUTPATIENT_CLINIC_OR_DEPARTMENT_OTHER): Payer: Self-pay | Admitting: Family Medicine

## 2023-11-29 ENCOUNTER — Encounter (HOSPITAL_BASED_OUTPATIENT_CLINIC_OR_DEPARTMENT_OTHER): Payer: Self-pay | Admitting: Family Medicine

## 2023-11-29 MED ORDER — ZEPBOUND 5 MG/0.5ML ~~LOC~~ SOAJ: 5.0000 mg | SUBCUTANEOUS | 2 refills | Status: DC

## 2023-11-29 NOTE — Telephone Encounter (Signed)
 Please see mychart message sent by pt and advise.

## 2023-12-01 NOTE — Telephone Encounter (Signed)
 Called pt's pharmacy about mychart sent by pt. Was told by pharmacy staff that they were able to get Rx to go through insurance without needing to have another PA. Was told that med would not be ready until Monday. Called pt to let him know this and he said to have them go ahead and order it. Called pharmacy back letting them know this and they said they would order it.nothing further needed.

## 2023-12-11 ENCOUNTER — Telehealth: Payer: Self-pay | Admitting: Cardiology

## 2023-12-11 ENCOUNTER — Encounter (HOSPITAL_BASED_OUTPATIENT_CLINIC_OR_DEPARTMENT_OTHER): Payer: Self-pay | Admitting: Family Medicine

## 2023-12-11 ENCOUNTER — Telehealth (INDEPENDENT_AMBULATORY_CARE_PROVIDER_SITE_OTHER): Admitting: Family Medicine

## 2023-12-11 ENCOUNTER — Encounter (HOSPITAL_BASED_OUTPATIENT_CLINIC_OR_DEPARTMENT_OTHER): Payer: Self-pay | Admitting: Pharmacist Clinician (PhC)/ Clinical Pharmacy Specialist

## 2023-12-11 DIAGNOSIS — G4733 Obstructive sleep apnea (adult) (pediatric): Secondary | ICD-10-CM

## 2023-12-11 DIAGNOSIS — E66812 Obesity, class 2: Secondary | ICD-10-CM

## 2023-12-11 MED ORDER — PROPRANOLOL HCL 10 MG PO TABS: 10.0000 mg | ORAL_TABLET | Freq: Two times a day (BID) | ORAL | 1 refills | Status: AC

## 2023-12-11 NOTE — Telephone Encounter (Signed)
 Patient calling to speak to with. Stating that the medication is causing his bp to go up. Calling to speak to you about it. Please advise

## 2023-12-11 NOTE — Progress Notes (Signed)
 Virtual Video Visit  I connected with Cory Frazier on 12/11/23 at  3:30 PM EDT by a video enabled telemedicine application and verified that I am speaking with the correct person using two identifiers.   Location patient: Home Location provider: Clinic Persons participating in the virtual visit: Patient; Damien Many CMA; Thersia Stark, FNP-C  I discussed the limitations of evaluation and management by telemedicine and the availability of in-person appointments. The patient expressed understanding and agreed to proceed.  Chief Complaint  Patient presents with   Medical Management of Chronic Issues    Patient has been having concerns about his BP. States that 3 days after taking injection, BP was 140/111. Has had multiple readings where BP was elevated. Has had some pain behind his eyes with a mild headache.    SUBJECTIVE:  HPI:  Patient having concerns regarding his blood pressure elevation with increase of recent Zepbound  dosage to 5 mg within the past week.  Patient states blood pressure well is well-controlled while on Zepbound  2.5 mg he is taking for OSA and related obesity.  Patient states when he initially began Wegovy  and Zepbound , his blood pressure increased slightly, and returned to baseline.  He reports when initiating his Zepbound  5 mg dosage last week on 12/04/2023.  Patient reports blood pressure readings on 12/08/2023 was 141/111 and 152/101.  This morning he states blood pressure was 152/92 upon waking up but improved to 130/89.  He reports previously before dose increase his blood pressure average 129/80.  Reports he is staying well-hydrated, but is concerned about blood pressure elevation.  He has not noticed significant weight loss with use of Zepbound  at this time.  Denies chest pain, dizziness, headache, decreased urine output.  Patient has not given himself his weekly injection of Zepbound  today.  Wt Readings from Last 3 Encounters:  09/27/23 219 lb 8 oz (99.6 kg)   06/28/23 230 lb (104.3 kg)  05/17/23 232 lb (105.2 kg)     The following portions of the patient's history were reviewed and updated as appropriate: medical history, surgical history, medications, allergies, social history, and family history.    Past Medical History:  Diagnosis Date   Anxiety    Asthma    Hyperlipidemia    Obesity    Palpitations    Polycythemia    Vitamin D  deficiency    History reviewed. No pertinent surgical history.   Current Outpatient Medications:    aspirin 81 MG chewable tablet, Chew 81 mg by mouth daily. Takes rarely, Disp: , Rfl:    atorvastatin  (LIPITOR) 20 MG tablet, TAKE 1 TABLET BY MOUTH DAILY, Disp: 90 tablet, Rfl: 3   hydrochlorothiazide  (MICROZIDE ) 12.5 MG capsule, Take 1 capsule (12.5 mg total) by mouth 2 (two) times daily., Disp: 180 capsule, Rfl: 1   propranolol  (INDERAL ) 20 MG tablet, Take 0.5 tablets (10 mg total) by mouth 2 (two) times daily., Disp: 180 tablet, Rfl: 3   tadalafil  (CIALIS ) 10 MG tablet, Take 1 tablet (10 mg total) by mouth every other day as needed for erectile dysfunction. Do not take more than once daily., Disp: 10 tablet, Rfl: 1   tirzepatide  (ZEPBOUND ) 5 MG/0.5ML Pen, Inject 5 mg into the skin once a week., Disp: 4 mL, Rfl: 2   valsartan  (DIOVAN ) 80 MG tablet, Take 1 tablet (80 mg total) by mouth 2 (two) times daily., Disp: 180 tablet, Rfl: 3 Allergies  Allergen Reactions   Codeine Other (See Comments)    unknown unknown   Claritin [Loratadine]  Anxiety    Social History   Socioeconomic History   Marital status: Single    Spouse name: Not on file   Number of children: Not on file   Years of education: Not on file   Highest education level: Not on file  Occupational History   Not on file  Tobacco Use   Smoking status: Never    Passive exposure: Never   Smokeless tobacco: Never  Vaping Use   Vaping status: Never Used  Substance and Sexual Activity   Alcohol use: Yes    Comment: Occasional    Drug use:  No   Sexual activity: Not Currently  Other Topics Concern   Not on file  Social History Narrative   Not on file   Social Drivers of Health   Financial Resource Strain: Patient Declined (05/01/2023)   Overall Financial Resource Strain (CARDIA)    Difficulty of Paying Living Expenses: Patient declined  Food Insecurity: No Food Insecurity (05/01/2023)   Hunger Vital Sign    Worried About Running Out of Food in the Last Year: Never true    Ran Out of Food in the Last Year: Never true  Transportation Needs: No Transportation Needs (05/01/2023)   PRAPARE - Administrator, Civil Service (Medical): No    Lack of Transportation (Non-Medical): No  Physical Activity: Inactive (05/01/2023)   Exercise Vital Sign    Days of Exercise per Week: 0 days    Minutes of Exercise per Session: 0 min  Stress: Stress Concern Present (05/01/2023)   [name]    Feeling of Stress : Rather much  Social Connections: Socially Isolated (05/01/2023)   Social Connection and Isolation Panel    Frequency of Communication with Friends and Family: More than three times a week    Frequency of Social Gatherings with Friends and Family: Once a week    Attends Religious Services: Never    Database administrator or Organizations: No    Attends Banker Meetings: Never    Marital Status: Never married  Intimate Partner Violence: Not At Risk (05/01/2023)   Humiliation, Afraid, Rape, and Kick questionnaire    Fear of Current or Ex-Partner: No    Emotionally Abused: No    Physically Abused: No    Sexually Abused: No    Family History  Problem Relation Age of Onset   Colon cancer Mother    Heart disease Father      ROS: A complete ROS was performed with pertinent positives/negatives noted in the HPI. The remainder of the ROS are negative.    OBJECTIVE:  VITALS per patient if applicable: There were no vitals filed for this  visit. There is no height or weight on file to calculate BMI.   GENERAL: Alert and oriented. Appears well and in no acute distress. HEENT: Atraumatic. Conjunctiva clear. No obvious abnormalities on inspection of external nose and ears. NECK: Normal movements of the head and neck. LUNGS: On inspection, no signs of respiratory distress. Breathing rate appears normal. No obvious gross SOB, gasping or wheezing, and no conversational dyspnea. CV: No obvious cyanosis. MS: Moves all visible extremities without noticeable abnormality. PSYCH/NEURO: Pleasant and cooperative. No obvious depression or anxiety. Speech and thought processing grossly intact.  ASSESSMENT AND PLAN: 1. Class 2 severe obesity due to excess calories with serious comorbidity in adult, unspecified BMI (HCC) (Primary) Given concern of hypersensitivity and/or delayed absorption of blood pressure medications causing increase  of blood pressure readings, patient will withhold his Zepbound  and stop use of GLP-1.  Will obtain BMP to check renal function as well.  Patient will continue blood pressure medication as directed and provide reading to PCP within 24 to 48 hours.  Will continue to follow-up with cardiology as scheduled. - Basic Metabolic Panel (BMET); Future  2. Moderate obstructive sleep apnea in adult Will provide further information regarding further weight loss measures such as diet, exercise, and medication management with healthy weight and wellness at time of follow-up.   I discussed the assessment and treatment plan with the patient. The patient was provided an opportunity to ask questions and all were answered. The patient agreed with the plan and demonstrated an understanding of the instructions.   The patient was advised to call back or seek an in-person evaluation if the symptoms worsen or if the condition fails to improve as anticipated.  I provided 10 minutes of non-face-to-face time during this  encounter.  Return if symptoms worsen or fail to improve.  Thersia Schuyler Stark, [place]

## 2023-12-11 NOTE — Telephone Encounter (Signed)
 Spoke with patient.  He notes that he took Wegovy  for about 3-4 months, and his pressure spiked up to 160's systolic a few days after the first dose.  Went back down to normal reading after that.  Switched to Zepbound  because had zero weight change on Wegovy . BP started spiking again, as high as 172/119, then would drop to 130-140 range and spike again.  Concerned that medication may be causing malabsorption of BP medications.  Explained that I haven't heard about this, but agreed that he should stop the Zepbound  for a few weeks.  He will reach out to me in 3-4 weeks and let me know if his BP readings normalize.    Also asked to change propranolol  20 mg tabs to 10 mg, as he takes 10 mg bid.  Notes 20 mg tabs are too hard and often break into pieces.    Rx send to [facility] on [address].

## 2023-12-11 NOTE — Addendum Note (Signed)
 Addended by: Shaunie Boehm on: 12/11/2023 04:06 PM   Modules accepted: Level of Service

## 2023-12-13 NOTE — Addendum Note (Signed)
 Addended by: RONNETTE DAMIEN SQUIBB on: 12/13/2023 04:26 PM   Modules accepted: Orders

## 2023-12-13 NOTE — Telephone Encounter (Signed)
 Please see new mychart message as an FYI.

## 2023-12-15 NOTE — Telephone Encounter (Signed)
 See other encounter

## 2023-12-26 ENCOUNTER — Other Ambulatory Visit

## 2023-12-26 ENCOUNTER — Ambulatory Visit: Admitting: Hematology and Oncology

## 2023-12-28 ENCOUNTER — Encounter (INDEPENDENT_AMBULATORY_CARE_PROVIDER_SITE_OTHER): Payer: Self-pay

## 2024-01-01 ENCOUNTER — Ambulatory Visit (HOSPITAL_BASED_OUTPATIENT_CLINIC_OR_DEPARTMENT_OTHER): Admitting: Family Medicine

## 2024-01-04 ENCOUNTER — Ambulatory Visit: Admitting: Gastroenterology

## 2024-01-16 ENCOUNTER — Encounter: Payer: Self-pay | Admitting: *Deleted

## 2024-01-17 ENCOUNTER — Ambulatory Visit: Attending: Cardiology | Admitting: Cardiology

## 2024-01-17 ENCOUNTER — Encounter: Payer: Self-pay | Admitting: Cardiology

## 2024-01-17 ENCOUNTER — Other Ambulatory Visit: Payer: Self-pay | Admitting: Cardiology

## 2024-01-17 ENCOUNTER — Other Ambulatory Visit (HOSPITAL_BASED_OUTPATIENT_CLINIC_OR_DEPARTMENT_OTHER): Payer: Self-pay | Admitting: Family Medicine

## 2024-01-17 VITALS — BP 122/86 | HR 79 | Ht 67.0 in | Wt 228.4 lb

## 2024-01-17 DIAGNOSIS — E782 Mixed hyperlipidemia: Secondary | ICD-10-CM | POA: Insufficient documentation

## 2024-01-17 DIAGNOSIS — E669 Obesity, unspecified: Secondary | ICD-10-CM | POA: Diagnosis not present

## 2024-01-17 DIAGNOSIS — I10 Essential (primary) hypertension: Secondary | ICD-10-CM | POA: Insufficient documentation

## 2024-01-17 NOTE — Patient Instructions (Signed)
 Medication Instructions:  Your physician recommends that you continue on your current medications as directed. Please refer to the Current Medication list given to you today.  *If you need a refill on your cardiac medications before your next appointment, please call your pharmacy*  Lab Work: Lipids If you have labs (blood work) drawn today and your tests are completely normal, you will receive your results only by: MyChart Message (if you have MyChart) OR A paper copy in the mail If you have any lab test that is abnormal or we need to change your treatment, we will call you to review the results.   Follow-Up: At [facility], you and your health needs are our priority.  As part of our continuing mission to provide you with exceptional heart care, our providers are all part of one team.  This team includes your primary Cardiologist (physician) and Advanced Practice Providers or APPs (Physician Assistants and Nurse Practitioners) who all work together to provide you with the care you need, when you need it.  Your next appointment:   1 year(s)  Provider:   Kardie Tobb, DO

## 2024-01-17 NOTE — Progress Notes (Signed)
 Cardiology Office Note:    Date:  01/17/2024   ID:  Cory Frazier, DOB 11/16/1975, MRN [id number]  PCP:  Cory Thersia Bitters, FNP  Cardiologist:  Cory Arguijo, DO  Electrophysiologist:  None   Referring MD: Cory Frazier, *    I am ok  History of Present Illness:    Cory Frazier is a 48 y.o. male with a hx of hypertension, morbid obesity, vitamin D  deficiency, anxiety and depression.  He is here today with his girlfriend for follow-up visit.    Today his biggest concern grossly blood pressure does not stay fix.  But he tells me he is doing well.  He anxiety has improved.  The patient reports that the readings vary, with a high of 140/85 mmHg in the morning, but generally lower readings throughout the day. The patient also reports experiencing intermittent severe leg pain, described as a stabbing sensation, which she rates as 10/10 at its worst. The pain comes and goes, lasting for a few days before disappearing for weeks. The patient also mentions a significant weight gain of approximately 20 pounds since September due to poor dietary habits. The patient has a history of anxiety, which has improved over the past year, but still experiences anxiety during medical appointments.    Past Medical History:  Diagnosis Date   Anxiety    Asthma    Hyperlipidemia    Obesity    Palpitations    Polycythemia    Vitamin D  deficiency     No past surgical history on file.  Current Medications: Current Meds  Medication Sig   aspirin 81 MG chewable tablet Chew 81 mg by mouth daily. Takes rarely   atorvastatin  (LIPITOR) 20 MG tablet TAKE 1 TABLET BY MOUTH DAILY   hydrochlorothiazide  (MICROZIDE ) 12.5 MG capsule Take 1 capsule (12.5 mg total) by mouth 2 (two) times daily.   propranolol  (INDERAL ) 10 MG tablet Take 1 tablet (10 mg total) by mouth 2 (two) times daily.   tadalafil  (CIALIS ) 10 MG tablet Take 1 tablet (10 mg total) by mouth every other day as needed for erectile  dysfunction. Do not take more than once daily.   valsartan  (DIOVAN ) 80 MG tablet Take 1 tablet (80 mg total) by mouth 2 (two) times daily.     Allergies:   Codeine and Claritin [loratadine]   Social History   Socioeconomic History   Marital status: Single    Spouse name: Not on file   Number of children: Not on file   Years of education: Not on file   Highest education level: Not on file  Occupational History   Not on file  Tobacco Use   Smoking status: Never    Passive exposure: Never   Smokeless tobacco: Never  Vaping Use   Vaping status: Never Used  Substance and Sexual Activity   Alcohol use: Yes    Comment: Occasional    Drug use: No   Sexual activity: Not Currently  Other Topics Concern   Not on file  Social History Narrative   Not on file   Social Drivers of Health   Financial Resource Strain: Patient Declined (05/01/2023)   Overall Financial Resource Strain (CARDIA)    Difficulty of Paying Living Expenses: Patient declined  Food Insecurity: No Food Insecurity (05/01/2023)   Hunger Vital Sign    Worried About Running Out of Food in the Last Year: Never true    Ran Out of Food in the Last Year: Never true  Transportation Needs:  No Transportation Needs (05/01/2023)   PRAPARE - Administrator, Civil Service (Medical): No    Lack of Transportation (Non-Medical): No  Physical Activity: Inactive (05/01/2023)   Exercise Vital Sign    Days of Exercise per Week: 0 days    Minutes of Exercise per Session: 0 min  Stress: Stress Concern Present (05/01/2023)   [name]    Feeling of Stress : Rather much  Social Connections: Socially Isolated (05/01/2023)   Social Connection and Isolation Panel    Frequency of Communication with Friends and Family: More than three times a week    Frequency of Social Gatherings with Friends and Family: Once a week    Attends Religious Services: Never    Automotive Engineer or Organizations: No    Attends Engineer, Structural: Never    Marital Status: Never married     Family History: The patient's family history includes Colon cancer in his mother; Heart disease in his father.  ROS:   Review of Systems  Constitution: Negative for decreased appetite, fever and weight gain.  HENT: Negative for congestion, ear discharge, hoarse voice and sore throat.   Eyes: Negative for discharge, redness, vision loss in right eye and visual halos.  Cardiovascular: Negative for chest pain, dyspnea on exertion, leg swelling, orthopnea and palpitations.  Respiratory: Negative for cough, hemoptysis, shortness of breath and snoring.   Endocrine: Negative for heat intolerance and polyphagia.  Hematologic/Lymphatic: Negative for bleeding problem. Does not bruise/bleed easily.  Skin: Negative for flushing, nail changes, rash and suspicious lesions.  Musculoskeletal: Negative for arthritis, joint pain, muscle cramps, myalgias, neck pain and stiffness.  Gastrointestinal: Negative for abdominal pain, bowel incontinence, diarrhea and excessive appetite.  Genitourinary: Negative for decreased libido, genital sores and incomplete emptying.  Neurological: Negative for brief paralysis, focal weakness, headaches and loss of balance.  Psychiatric/Behavioral: Negative for altered mental status, depression and suicidal ideas.  Allergic/Immunologic: Negative for HIV exposure and persistent infections.    EKGs/Labs/Other Studies Reviewed:    The following studies were reviewed today:   EKG: Sinus rhythm, heart rate 82 bpm.  Recent Labs: 04/21/2023: ALT 24; BUN 22; Creatinine 1.11; Hemoglobin 18.1; Platelet Count 237; Potassium 3.7; Sodium 138  Recent Lipid Panel    Component Value Date/Time   CHOL 181 04/20/2023 1009   TRIG 162 (H) 04/20/2023 1009   HDL 51 04/20/2023 1009   CHOLHDL 3.5 04/20/2023 1009   LDLCALC 102 (H) 04/20/2023 1009    Physical Exam:     VS:  BP 122/86 (BP Location: Left Arm, Patient Position: Sitting, Cuff Size: Large)   Pulse 79   Ht 5' 7 (1.702 m)   Wt 228 lb 6.4 oz (103.6 kg)   SpO2 99%   BMI 35.77 kg/m     Wt Readings from Last 3 Encounters:  01/17/24 228 lb 6.4 oz (103.6 kg)  09/27/23 219 lb 8 oz (99.6 kg)  06/28/23 230 lb (104.3 kg)     GEN: Well nourished, well developed in no acute distress HEENT: Normal NECK: No JVD; No carotid bruits LYMPHATICS: No lymphadenopathy CARDIAC: S1S2 noted,RRR, no murmurs, rubs, gallops RESPIRATORY:  Clear to auscultation without rales, wheezing or rhonchi  ABDOMEN: Soft, non-tender, non-distended, +bowel sounds, no guarding. EXTREMITIES: No edema, No cyanosis, no clubbing MUSCULOSKELETAL:  No deformity  SKIN: Warm and dry NEUROLOGIC:  Alert and oriented x 3, non-focal PSYCHIATRIC:  Normal affect, good insight  ASSESSMENT:  1. Mixed hyperlipidemia   2. Primary hypertension   3. Obesity (BMI 30-39.9)    PLAN:    Hypertension-I congratulated the patient today his blood pressure is at target no medication changes..  Hyperlipidemia Patient is currently on Atorvastatin .  We need to repeat blood work unfortunately he is not ready to get this work done today.  He asked to have this done in a month, I have ordered the lab work and we will wait for him to get it done and make further recommendation.  The patient understands the need to lose weight with diet and exercise. We have discussed specific strategies for this.   Follow-up in 6 months.   The patient is in agreement with the above plan. The patient left the office in stable condition.  The patient will follow up in   Medication Adjustments/Labs and Tests Ordered: Current medicines are reviewed at length with the patient today.  Concerns regarding medicines are outlined above.  Orders Placed This Encounter  Procedures   Lipid panel   No orders of the defined types were placed in this  encounter.   Patient Instructions  Medication Instructions:  Your physician recommends that you continue on your current medications as directed. Please refer to the Current Medication list given to you today.  *If you need a refill on your cardiac medications before your next appointment, please call your pharmacy*  Lab Work: Lipids If you have labs (blood work) drawn today and your tests are completely normal, you will receive your results only by: MyChart Message (if you have MyChart) OR A paper copy in the mail If you have any lab test that is abnormal or we need to change your treatment, we will call you to review the results.   Follow-Up: At [facility], you and your health needs are our priority.  As part of our continuing mission to provide you with exceptional heart care, our providers are all part of one team.  This team includes your primary Cardiologist (physician) and Advanced Practice Providers or APPs (Physician Assistants and Nurse Practitioners) who all work together to provide you with the care you need, when you need it.  Your next appointment:   1 year(s)  Provider:   Bhavin Monjaraz, DO    Adopting a Healthy Lifestyle.  Know what a healthy weight is for you (roughly BMI <25) and aim to maintain this   Aim for 7+ servings of fruits and vegetables daily   65-80+ fluid ounces of water or unsweet tea for healthy kidneys   Limit to max 1 drink of alcohol per day; avoid smoking/tobacco   Limit animal fats in diet for cholesterol and heart health - choose grass fed whenever available   Avoid highly processed foods, and foods high in saturated/trans fats   Aim for low stress - take time to unwind and care for your mental health   Aim for 150 min of moderate intensity exercise weekly for heart health, and weights twice weekly for bone health   Aim for 7-9 hours of sleep daily   When it comes to diets, agreement about the perfect plan isnt easy to find,  even among the experts. Experts at the [facility] [employer] developed an idea known as the Healthy Eating Plate. Just imagine a plate divided into logical, healthy portions.   The emphasis is on diet quality:   Load up on vegetables and fruits - one-half of your plate: Aim for color and variety,  and remember that potatoes dont count.   Go for whole grains - one-quarter of your plate: Whole wheat, barley, wheat berries, quinoa, oats, brown rice, and foods made with them. If you want pasta, go with whole wheat pasta.   Protein power - one-quarter of your plate: Fish, chicken, beans, and nuts are all healthy, versatile protein sources. Limit red meat.   The diet, however, does go beyond the plate, offering a few other suggestions.   Use healthy plant oils, such as olive, canola, soy, corn, sunflower and peanut. Check the labels, and avoid partially hydrogenated oil, which have unhealthy trans fats.   If youre thirsty, drink water. Coffee and tea are good in moderation, but skip sugary drinks and limit milk and dairy products to one or two daily servings.   The type of carbohydrate in the diet is more important than the amount. Some sources of carbohydrates, such as vegetables, fruits, whole grains, and beans-are healthier than others.   Finally, stay active  Signed, Dub Huntsman, DO  01/17/2024 9:19 AM    [facility]

## 2024-01-19 ENCOUNTER — Telehealth: Payer: Self-pay | Admitting: Cardiology

## 2024-01-19 MED ORDER — VALSARTAN 80 MG PO TABS: 80.0000 mg | ORAL_TABLET | Freq: Two times a day (BID) | ORAL | 3 refills | Status: AC

## 2024-01-19 NOTE — Telephone Encounter (Signed)
 Refill sent to pharmacy.

## 2024-01-19 NOTE — Telephone Encounter (Signed)
*  STAT* If patient is at the pharmacy, call can be transferred to refill team.   1. Which medications need to be refilled? (please list name of each medication and dose if known)   valsartan  (DIOVAN ) 80 MG tablet    2. Which pharmacy/location (including street and city if local pharmacy) is medication to be sent to?  [id number] - [place], [place] - [address]      3. Do they need a 30 day or 90 day supply? 90 day   Pt is out of medication

## 2024-01-25 ENCOUNTER — Telehealth: Payer: Self-pay | Admitting: Hematology and Oncology

## 2024-01-26 ENCOUNTER — Inpatient Hospital Stay: Admitting: Hematology and Oncology

## 2024-01-26 ENCOUNTER — Inpatient Hospital Stay

## 2024-01-29 ENCOUNTER — Encounter (HOSPITAL_BASED_OUTPATIENT_CLINIC_OR_DEPARTMENT_OTHER): Payer: Self-pay | Admitting: Family Medicine

## 2024-01-29 DIAGNOSIS — L720 Epidermal cyst: Secondary | ICD-10-CM | POA: Insufficient documentation

## 2024-01-29 NOTE — Telephone Encounter (Signed)
 Please see mychart message sent by pt and advise.

## 2024-01-30 ENCOUNTER — Ambulatory Visit (HOSPITAL_BASED_OUTPATIENT_CLINIC_OR_DEPARTMENT_OTHER): Admitting: Family Medicine

## 2024-01-30 ENCOUNTER — Encounter (HOSPITAL_BASED_OUTPATIENT_CLINIC_OR_DEPARTMENT_OTHER): Payer: Self-pay | Admitting: Family Medicine

## 2024-01-30 VITALS — HR 71 | Ht 67.0 in | Wt 230.0 lb

## 2024-01-30 DIAGNOSIS — L720 Epidermal cyst: Secondary | ICD-10-CM

## 2024-01-30 MED ORDER — DOXYCYCLINE HYCLATE 100 MG PO TABS: 100.0000 mg | ORAL_TABLET | Freq: Two times a day (BID) | ORAL | 0 refills | Status: DC

## 2024-01-30 NOTE — Progress Notes (Signed)
 Acute Care Office Visit  Subjective:   Cory Frazier Aug 11, 1975 01/30/2024  Chief Complaint  Patient presents with   Cyst    Pt is here today to have a cyst looked at/felt.    HPI: Patient reports history of epidermal cyst to his upper mid back that has been there for several years.  Patient had I&D and month-long antibiotic course in 2020 due to enlargement and drainage with malodor.  Patient states there is no surgical procedure to remove the sac lining of the cyst and he did not follow through with surgery due to improvement of cyst following antibiotic course.  He has noticed increased pressure, discomfort and drainage to the cyst from the midline upper back.  Denies fevers, chills.  Previously was seen by dermatology and he attempted to reach out to dermatology to schedule an appointment but states he is unable to get in until August 2026.  Reports he has also felt a new cyst present to his left thoracic back area without drainage or pain.   The following portions of the patient's history were reviewed and updated as appropriate: past medical history, past surgical history, family history, social history, allergies, medications, and problem list.   Patient Active Problem List   Diagnosis Date Noted   Epidermal inclusion cyst 01/29/2024   Moderate obstructive sleep apnea in adult 06/28/2023   Class 2 severe obesity due to excess calories with serious comorbidity in adult 06/28/2023   Short sleeper 05/17/2023   Snoring 05/17/2023   At risk for obstructive sleep apnea 05/17/2023   GERD with apnea 02/22/2023   Polycythemia 10/11/2022   Pain of left anterior lower extremity 10/11/2022   Sebaceous cyst 06/30/2022   Chronic ethmoidal sinusitis 05/23/2022   Deviated nasal septum 05/23/2022   Hypertension 05/23/2022   Orthostatic dizziness 05/23/2022   Sleep-disordered breathing 05/23/2022   GAD (generalized anxiety disorder) 03/29/2022   Panic anxiety syndrome 03/29/2022    Hyperlipidemia 03/01/2022   Vitamin D  deficiency 06/30/2020   Past Medical History:  Diagnosis Date   Anxiety    Asthma    Hyperlipidemia    Obesity    Palpitations    Polycythemia    Vitamin D  deficiency    History reviewed. No pertinent surgical history. Family History  Problem Relation Age of Onset   Colon cancer Mother    Heart disease Father    Outpatient Medications Prior to Visit  Medication Sig Dispense Refill   aspirin 81 MG chewable tablet Chew 81 mg by mouth daily. Takes rarely     atorvastatin  (LIPITOR) 20 MG tablet TAKE 1 TABLET BY MOUTH DAILY 90 tablet 3   hydrochlorothiazide  (MICROZIDE ) 12.5 MG capsule TAKE 1 CAPSULE BY MOUTH 2 TIMES A DAY 180 capsule 2   propranolol  (INDERAL ) 10 MG tablet Take 1 tablet (10 mg total) by mouth 2 (two) times daily. 180 tablet 1   tadalafil  (CIALIS ) 10 MG tablet Take 1 tablet (10 mg total) by mouth every other day as needed for erectile dysfunction. Do not take more than once daily. 10 tablet 1   valsartan  (DIOVAN ) 80 MG tablet Take 1 tablet (80 mg total) by mouth 2 (two) times daily. 180 tablet 3   No facility-administered medications prior to visit.   Allergies  Allergen Reactions   Codeine Other (See Comments)    unknown unknown   Claritin [Loratadine] Anxiety     ROS: A complete ROS was performed with pertinent positives/negatives noted in the HPI. The remainder  of the ROS are negative.    Objective:   Today's Vitals   01/30/24 0951  Pulse: 71  SpO2: 98%  Weight: 230 lb (104.3 kg)  Height: 5' 7 (1.702 m)    GENERAL: Well-appearing, in NAD. Well nourished.  SKIN: Pink, warm and dry. Small induration and cyst with punctate opening approx. 2cm x 2cm to midline upper back with clear drainage. Small 1cm x 1 cm papular cyst without drainage, erythema or pain present to left thoracic back.  Head: Normocephalic. NECK: Trachea midline. Full ROM w/o pain or tenderness.  THROAT: Uvula midline. Oropharynx clear. Mucous  membranes pink and moist.  RESPIRATORY: Chest wall symmetrical. Respirations even and non-labored.  MSK: Muscle tone and strength appropriate for age.  NEUROLOGIC: No motor or sensory deficits. Steady, even gait. C2-C12 intact.  PSYCH/MENTAL STATUS: Alert, oriented x 3. Cooperative, appropriate mood and affect.   Upper Midline Back induration:    Left Thoracic Back:       Assessment & Plan:  1. Epidermal cyst (Primary) Start Doxycycline 100mg  Bid x 7 days. Given hx of epidermal cyst, this is likely recurrence and requiring surgical intervention to prevent recurrence. Pt agreeable and referral placed to [facility]. Warm compresses recommended. If no improvement or worsening prior to referral, will reach out to PCP.  - doxycycline (VIBRA-TABS) 100 MG tablet; Take 1 tablet (100 mg total) by mouth 2 (two) times daily.  Dispense: 14 tablet; Refill: 0 - Ambulatory referral to General Surgery   Meds ordered this encounter  Medications   doxycycline (VIBRA-TABS) 100 MG tablet    Sig: Take 1 tablet (100 mg total) by mouth 2 (two) times daily.    Dispense:  14 tablet    Refill:  0    Supervising Provider:   DE CUBA, RAYMOND J [[id number]]    Return if symptoms worsen or fail to improve.    Patient to reach out to office if new, worrisome, or unresolved symptoms arise or if no improvement in patient's condition. Patient verbalized understanding and is agreeable to treatment plan. All questions answered to patient's satisfaction.    Thersia Schuyler Stark, [place]

## 2024-01-30 NOTE — Patient Instructions (Signed)
[facility] [address], [place], [place] [address] Phone: [phone]

## 2024-01-31 ENCOUNTER — Encounter (HOSPITAL_BASED_OUTPATIENT_CLINIC_OR_DEPARTMENT_OTHER): Payer: Self-pay

## 2024-02-09 ENCOUNTER — Other Ambulatory Visit (HOSPITAL_BASED_OUTPATIENT_CLINIC_OR_DEPARTMENT_OTHER): Payer: Self-pay | Admitting: Family Medicine

## 2024-02-09 ENCOUNTER — Telehealth (HOSPITAL_BASED_OUTPATIENT_CLINIC_OR_DEPARTMENT_OTHER): Payer: Self-pay | Admitting: Family Medicine

## 2024-02-09 DIAGNOSIS — L720 Epidermal cyst: Secondary | ICD-10-CM

## 2024-02-09 MED ORDER — DOXYCYCLINE HYCLATE 100 MG PO TABS: 100.0000 mg | ORAL_TABLET | Freq: Two times a day (BID) | ORAL | 0 refills | Status: AC

## 2024-02-09 NOTE — Telephone Encounter (Signed)
 Routing encounter to [place] for further review. Please advise if you are okay sending a refill in for pt.

## 2024-02-09 NOTE — Telephone Encounter (Signed)
 Copied from CRM [id number]. Topic: Clinical - Medication Refill >> Feb 09, 2024 11:37 AM Tiffini S wrote: Medication: doxycycline  (VIBRA -TABS) 100 MG tablet  Has the patient contacted their pharmacy? No (Agent: If no, request that the patient contact the pharmacy for the refill. If patient does not wish to contact the pharmacy document the reason why and proceed with request.) (Agent: If yes, when and what did the pharmacy advise?)  This is the patient's preferred pharmacy:  [facility] [id number] - [place], [place] - [address] [place] [address] Phone: [phone] Fax: [phone]  Is this the correct pharmacy for this prescription? Yes If no, delete pharmacy and type the correct one.   Has the prescription been filled recently? Yes  Is the patient out of the medication? Yes, no refills  Has the patient been seen for an appointment in the last year OR does the patient have an upcoming appointment? Yes  Can we respond through MyChart? Yes  Agent: Please be advised that Rx refills may take up to 3 business days. We ask that you follow-up with your pharmacy.

## 2024-02-09 NOTE — Telephone Encounter (Unsigned)
 Copied from CRM [id number]. Topic: Clinical - Prescription Issue >> Feb 09, 2024 11:48 AM Tiffini S wrote: Reason for CRM: Patient called stating that he has sent several MyCHART messages to the provider/ states that the cyst is still infected and he is out of doxycycline  (VIBRA -TABS) 100 MG tablet- will need medication refill today before office close  Called [facility], spoke with Wanetta- will call the patient back today in regards to messages/ request   Please call the patient at [phone]

## 2024-02-09 NOTE — Telephone Encounter (Signed)
 Mychart was sent to pt letting him know the info about general surgery referral so he could reach out to them to try to get an appt scheduled. Pt has also been made aware that abx was refilled.

## 2024-02-12 DIAGNOSIS — L723 Sebaceous cyst: Secondary | ICD-10-CM | POA: Diagnosis not present

## 2024-02-21 DIAGNOSIS — L723 Sebaceous cyst: Secondary | ICD-10-CM | POA: Diagnosis not present

## 2024-02-27 ENCOUNTER — Ambulatory Visit: Admitting: Gastroenterology

## 2024-03-04 ENCOUNTER — Telehealth: Payer: Self-pay

## 2024-03-04 NOTE — Telephone Encounter (Signed)
° °  Per Dr Federico this patient has a visit scheduled with us  tomorrow. He was last seen in  sleep medicine in March 2025. Please assure he is using his CPAP machine. If he is not using the CPAP we would need to cancel his visit and schedule after he starts using the machine (typically about 3 months after)  Called pt; left message. Called Ms Isidor and spoke with her. She stated Mr Anctil returned the CPAP after a wk as it made him too anxious to use it. Explained to her that he needs to use the CPAP 3 mos prior to scheduling an appt with labs/Dr Federico. Ms Isidor reported he had already canceled his appt with Dr Federico for 12/16 and rescheduled for 04/11/24. Advised a Jan appt is not long enough time to use the device to determine effectiveness. Per Dr Federico,  it needs to be 3 mos.  Advised to update the pt's family medicine provider that he is not been using the CPAP and seek a solution for using the device. Ms Isidor verbalized an understanding and she stated she would text the pt to return our call and also give him the message about his appointments.  Dr Federico made aware.

## 2024-03-04 NOTE — Progress Notes (Unsigned)
 [facility] [phone]   [phone]  PROGRESS NOTE  Patient Care Team: Knute Thersia Bitters, FNP as PCP - General (Family Medicine) Tobb, Kardie, DO as PCP - Cardiology (Cardiology)  CHIEF COMPLAINTS/PURPOSE OF CONSULTATION:  Secondary polycythemia  HISTORY OF PRESENTING ILLNESS:  Cory Frazier 48 y.o. 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   On exam today, Cory Frazier reports ***   MEDICAL HISTORY:  Past Medical History:  Diagnosis Date   Anxiety    Asthma    Hyperlipidemia    Obesity    Palpitations    Polycythemia    Vitamin D  deficiency     SURGICAL HISTORY: No past surgical history on file.  SOCIAL HISTORY: Social History   Socioeconomic History   Marital status: Single    Spouse name: Not on file   Number of children: Not on file   Years of education: Not on file   Highest education level: Not on file  Occupational History   Not on file  Tobacco Use   Smoking status: Never    Passive exposure: Never   Smokeless tobacco: Never  Vaping Use   Vaping status: Never Used  Substance and Sexual Activity   Alcohol use: Yes    Comment: Occasional    Drug use: No   Sexual activity: Not Currently  Other Topics Concern   Not on file  Social History Narrative   Not on file   Social Drivers of Health   Tobacco Use: Low Risk  (02/21/2024)   Received from [facility]   Patient History    Smoking Tobacco Use: Never    Smokeless Tobacco Use: Never    Passive Exposure: Never  Financial Resource Strain: Patient Declined (05/01/2023)   Overall Financial Resource Strain (CARDIA)    Difficulty of Paying Living Expenses: Patient declined  Food Insecurity: No Food Insecurity (05/01/2023)   Hunger Vital Sign    Worried About Running Out of Food in the Last Year: Never true    Ran Out of Food in the Last Year: Never true  Transportation Needs: No Transportation Needs (05/01/2023)   PRAPARE -  Administrator, Civil Service (Medical): No    Lack of Transportation (Non-Medical): No  Physical Activity: Inactive (05/01/2023)   Exercise Vital Sign    Days of Exercise per Week: 0 days    Minutes of Exercise per Session: 0 min  Stress: Stress Concern Present (05/01/2023)   [name]    Feeling of Stress : Rather much  Social Connections: Socially Isolated (05/01/2023)   Social Connection and Isolation Panel    Frequency of Communication with Friends and Family: More than three times a week    Frequency of Social Gatherings with Friends and Family: Once a week    Attends Religious Services: Never    Database Administrator or Organizations: No    Attends Banker Meetings: Never    Marital Status: Never married  Intimate Partner Violence: Not At Risk (05/01/2023)   Humiliation, Afraid, Rape, and Kick questionnaire    Fear of Current or Ex-Partner: No    Emotionally Abused: No    Physically Abused: No    Sexually Abused: No  Depression (PHQ2-9): High Risk (12/11/2023)   Depression (PHQ2-9)    PHQ-2 Score: 13  Alcohol Screen: Low Risk (05/01/2023)   Alcohol Screen    Last Alcohol Screening Score (AUDIT): 2  Housing: Low Risk (05/23/2022)   Received from [facility]   Epic    What is your living situation today?: I have a steady place to live    Think about the place you live. Do you have problems with any of the following? Choose all that apply:: Not on file  Utilities: Not At Risk (05/01/2023)   [facility] Utilities    Threatened with loss of utilities: No  Health Literacy: Adequate Health Literacy (05/01/2023)   B1300 Health Literacy    Frequency of need for help with medical instructions: Never    FAMILY HISTORY: Family History  Problem Relation Age of Onset   Colon cancer Mother    Heart disease Father     ALLERGIES:  is allergic to codeine and claritin [loratadine].  MEDICATIONS:   Current Outpatient Medications  Medication Sig Dispense Refill   aspirin 81 MG chewable tablet Chew 81 mg by mouth daily. Takes rarely     atorvastatin  (LIPITOR) 20 MG tablet TAKE 1 TABLET BY MOUTH DAILY 90 tablet 3   doxycycline  (VIBRA -TABS) 100 MG tablet Take 1 tablet (100 mg total) by mouth 2 (two) times daily. 14 tablet 0   hydrochlorothiazide  (MICROZIDE ) 12.5 MG capsule TAKE 1 CAPSULE BY MOUTH 2 TIMES A DAY 180 capsule 2   propranolol  (INDERAL ) 10 MG tablet Take 1 tablet (10 mg total) by mouth 2 (two) times daily. 180 tablet 1   tadalafil  (CIALIS ) 10 MG tablet Take 1 tablet (10 mg total) by mouth every other day as needed for erectile dysfunction. Do not take more than once daily. 10 tablet 1   valsartan  (DIOVAN ) 80 MG tablet Take 1 tablet (80 mg total) by mouth 2 (two) times daily. 180 tablet 3   No current facility-administered medications for this visit.    REVIEW OF SYSTEMS:   Constitutional: ( - ) fevers, ( - )  chills , ( - ) night sweats Eyes: ( - ) blurriness of vision, ( - ) double vision, ( - ) watery eyes Ears, nose, mouth, throat, and face: ( - ) mucositis, ( - ) sore throat Respiratory: ( - ) cough, ( - ) dyspnea, ( - ) wheezes Cardiovascular: ( - ) palpitation, ( - ) chest discomfort, ( - ) lower extremity swelling Gastrointestinal:  ( - ) nausea, ( - ) heartburn, ( - ) change in bowel habits Skin: ( - ) abnormal skin rashes Lymphatics: ( - ) new lymphadenopathy, ( - ) easy bruising Neurological: ( - ) numbness, ( - ) tingling, ( - ) new weaknesses Behavioral/Psych: ( - ) mood change, ( - ) new changes  All other systems were reviewed with the patient and are negative.  PHYSICAL EXAMINATION: ECOG PERFORMANCE STATUS: 1 - Symptomatic but completely ambulatory  There were no vitals filed for this visit.   There were no vitals filed for this visit.    GENERAL: well appearing male in NAD  SKIN: skin color, texture, turgor are normal, no rashes or significant  lesions EYES: conjunctiva are pink and non-injected, sclera clear LUNGS: clear to auscultation and percussion with normal breathing effort HEART: regular rate & rhythm and no murmurs and no lower extremity edema Musculoskeletal: no cyanosis of digits and no clubbing  PSYCH: alert & oriented x 3, fluent speech NEURO: no focal motor/sensory deficits  LABORATORY DATA:  I have reviewed the data as listed    Latest Ref Rng & Units 04/21/2023    9:12 AM 09/16/2022   10:10 AM  03/16/2022   12:16 PM  CBC  WBC 4.0 - 10.5 K/uL 8.1  8.1  5.5   Hemoglobin 13.0 - 17.0 g/dL 81.8  82.4  82.0   Hematocrit 39.0 - 52.0 % 52.4  48.8  49.8   Platelets 150 - 400 K/uL 237  215  210        Latest Ref Rng & Units 04/21/2023    9:12 AM 09/16/2022   10:10 AM 03/16/2022   12:16 PM  CMP  Glucose 70 - 99 mg/dL 94  86  888   BUN 6 - 20 mg/dL 22  17  13    Creatinine 0.61 - 1.24 mg/dL 8.88  8.96  8.87   Sodium 135 - 145 mmol/L 138  137  134   Potassium 3.5 - 5.1 mmol/L 3.7  3.4  3.5   Chloride 98 - 111 mmol/L 102  102  99   CO2 22 - 32 mmol/L 28  29  30    Calcium  8.9 - 10.3 mg/dL 9.5  9.6  9.9   Total Protein 6.5 - 8.1 g/dL 7.1  7.4  7.1   Total Bilirubin 0.0 - 1.2 mg/dL 0.9  0.7  0.7   Alkaline Phos 38 - 126 U/L 60  67  52   AST 15 - 41 U/L 21  19  25    ALT 0 - 44 U/L 24  23  36    RADIOGRAPHIC STUDIES: I have personally reviewed the radiological images as listed and agreed with the findings in the report. No results found.  ASSESSMENT & PLAN Keishaun Hazel is a 48 y.o. male who returns for a follow up for polycythemia. SABRA   #Polycythemia, most likely secondary: --workup from 03/16/2022 showed no evidence of MPN as panel showed no driver mutations or BCR/ABL rearrangement.  --patient is a non smoker and does not use any testosterone containing products --patient has symptoms concerning for sleep apnea, but was not able to complete a sleep study as it wasn't approved by his insurance at the time. We  will refer again for polysomnography  --labs today show WBC ***  --RTC in 6 months or sooner if indicated by the above labs.   No orders of the defined types were placed in this encounter.  All questions were answered. The patient knows to call the clinic with any problems, questions or concerns.  I have spent a total of 25 minutes minutes of face-to-face and non-face-to-face time, preparing to see the patient, performing a medically appropriate examination, counseling and educating the patient, referring and communicating with other health care professionals, documenting clinical information in the electronic health record,  and care coordination.   Norleen IVAR Kidney, MD Department of Hematology/Oncology [facility] Phone: [phone] Pager: [phone] Email: [email or web address]

## 2024-03-04 NOTE — Telephone Encounter (Signed)
 Mr Hulbert returned call.  He agreed to talk with his primary care provider to seek solutions to use the CPAP.  He stated there is a mouthpiece he has heard about that may work for him to be successful in using the device. He declared he would call us  after using the CPAP to arrange a follow up appt.

## 2024-03-05 ENCOUNTER — Inpatient Hospital Stay: Admitting: Hematology and Oncology

## 2024-03-05 ENCOUNTER — Inpatient Hospital Stay

## 2024-04-09 ENCOUNTER — Institutional Professional Consult (permissible substitution) (INDEPENDENT_AMBULATORY_CARE_PROVIDER_SITE_OTHER): Admitting: Family Medicine

## 2024-04-10 ENCOUNTER — Telehealth (HOSPITAL_BASED_OUTPATIENT_CLINIC_OR_DEPARTMENT_OTHER): Payer: Self-pay | Admitting: Pharmacy Technician

## 2024-04-10 NOTE — Telephone Encounter (Signed)
 Pharmacy Patient Advocate Encounter   Received notification from Onbase CMM KEY that prior authorization for Zepbound  2.5MG /0.5ML pen-injectors is due for renewal.   Insurance verification completed.   The patient is insured through [employer].  Action: Medication has been discontinued. Archived Key: [id number]

## 2024-04-11 ENCOUNTER — Inpatient Hospital Stay: Admitting: Hematology and Oncology

## 2024-04-11 ENCOUNTER — Inpatient Hospital Stay

## 2024-04-19 ENCOUNTER — Encounter (HOSPITAL_BASED_OUTPATIENT_CLINIC_OR_DEPARTMENT_OTHER): Payer: Self-pay | Admitting: Family Medicine

## 2024-04-19 NOTE — Telephone Encounter (Signed)
 Please see mychart message sent by pt and advise.

## 2024-04-22 ENCOUNTER — Encounter (HOSPITAL_BASED_OUTPATIENT_CLINIC_OR_DEPARTMENT_OTHER): Payer: Self-pay | Admitting: Family Medicine

## 2024-04-22 ENCOUNTER — Telehealth (INDEPENDENT_AMBULATORY_CARE_PROVIDER_SITE_OTHER): Admitting: Family Medicine

## 2024-04-22 VITALS — Ht 67.0 in | Wt 230.0 lb

## 2024-04-22 DIAGNOSIS — G4733 Obstructive sleep apnea (adult) (pediatric): Secondary | ICD-10-CM

## 2024-04-22 DIAGNOSIS — E559 Vitamin D deficiency, unspecified: Secondary | ICD-10-CM

## 2024-04-22 DIAGNOSIS — E66812 Obesity, class 2: Secondary | ICD-10-CM | POA: Diagnosis not present

## 2024-04-22 DIAGNOSIS — E782 Mixed hyperlipidemia: Secondary | ICD-10-CM | POA: Diagnosis not present

## 2024-04-22 DIAGNOSIS — D751 Secondary polycythemia: Secondary | ICD-10-CM

## 2024-04-22 NOTE — Progress Notes (Signed)
 "  Virtual Video Visit  I connected with Cory Frazier on 04/22/24 at 10:10 AM EST by a video enabled telemedicine application and verified that I am speaking with the correct person using two identifiers.   Location patient: Home Location provider: Home Persons participating in the virtual visit: Patient; Damien Many CMA; Thersia Stark, FNP-C  I discussed the limitations of evaluation and management by telemedicine and the availability of in-person appointments. The patient expressed understanding and agreed to proceed.  Chief Complaint  Patient presents with   Sleep Apnea    Pt has questions about sleep apnea. Also states he was seeing oncology due to some elevated blood levels but then was told that oncology did not want to see him any longer to have the bloodwork done and wanted pt to contact sleep apnea location he was going to about his machine. Pt is requesting to have bloodwork done for checkup.    SUBJECTIVE:  HPI:  Cory Frazier presents for requesting labwork for chronic conditions.   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 He is not adherent with use of CPAP. He would like to have his CBC repeated by PCP as Oncology will follow up in 3 months after 3 months of continous CPAP use.   He is managed for his OSA by [facility] and is due for follow up. He was previously on GLP 1 for OSA but is not currently taking this. He would like to discuss a possible mouth piece instead of CPAP for his OSA with neurology.   He is desiring blood work for check of cholesterol, vitamin D  and CBC, and CMP.   HYPERLIPIDEMIA: Cory Frazier presents for the medical management of hyperlipidemia.  Patient's current HLD regimen is: Atorvastatin  20mg   Patient is  currently taking prescribed medications for HLD.  Denies myalgias.   Lab Results  Component Value Date   CHOL 181 04/20/2023   HDL 51 04/20/2023   LDLCALC 102 (H) 04/20/2023   TRIG 162 (H)  04/20/2023   CHOLHDL 3.5 04/20/2023     The following portions of the patient's history were reviewed and updated as appropriate: medical history, surgical history, medications, allergies, social history, and family history.    Past Medical History:  Diagnosis Date   Anxiety    Asthma    Hyperlipidemia    Obesity    Palpitations    Polycythemia    Vitamin D  deficiency    History reviewed. No pertinent surgical history.  Current Medications[1] Allergies[2]  Social History   Socioeconomic History   Marital status: Single    Spouse name: Not on file   Number of children: Not on file   Years of education: Not on file   Highest education level: Not on file  Occupational History   Not on file  Tobacco Use   Smoking status: Never    Passive exposure: Never   Smokeless tobacco: Never  Vaping Use   Vaping status: Never Used  Substance and Sexual Activity   Alcohol use: Yes    Comment: Occasional    Drug use: No   Sexual activity: Not Currently  Other Topics Concern   Not on file  Social History Narrative   Not on file   Social Drivers of Health   Tobacco Use: Low Risk (04/22/2024)   Patient History    Smoking Tobacco Use: Never    Smokeless Tobacco Use: Never    Passive Exposure: Never  Financial Resource Strain: Patient Declined (  05/01/2023)   Overall Financial Resource Strain (CARDIA)    Difficulty of Paying Living Expenses: Patient declined  Food Insecurity: No Food Insecurity (05/01/2023)   Hunger Vital Sign    Worried About Running Out of Food in the Last Year: Never true    Ran Out of Food in the Last Year: Never true  Transportation Needs: No Transportation Needs (05/01/2023)   PRAPARE - Administrator, Civil Service (Medical): No    Lack of Transportation (Non-Medical): No  Physical Activity: Inactive (05/01/2023)   Exercise Vital Sign    Days of Exercise per Week: 0 days    Minutes of Exercise per Session: 0 min  Stress: Stress Concern  Present (05/01/2023)   [name]    Feeling of Stress : Rather much  Social Connections: Socially Isolated (05/01/2023)   Social Connection and Isolation Panel    Frequency of Communication with Friends and Family: More than three times a week    Frequency of Social Gatherings with Friends and Family: Once a week    Attends Religious Services: Never    Database Administrator or Organizations: No    Attends Banker Meetings: Never    Marital Status: Never married  Intimate Partner Violence: Not At Risk (05/01/2023)   Humiliation, Afraid, Rape, and Kick questionnaire    Fear of Current or Ex-Partner: No    Emotionally Abused: No    Physically Abused: No    Sexually Abused: No  Depression (PHQ2-9): High Risk (12/11/2023)   Depression (PHQ2-9)    PHQ-2 Score: 13  Alcohol Screen: Low Risk (05/01/2023)   Alcohol Screen    Last Alcohol Screening Score (AUDIT): 2  Housing: Low Risk (05/23/2022)   Received from [facility]   Epic    What is your living situation today?: I have a steady place to live    Think about the place you live. Do you have problems with any of the following? Choose all that apply:: Not on file  Utilities: Not At Risk (05/01/2023)   AHC Utilities    Threatened with loss of utilities: No  Health Literacy: Adequate Health Literacy (05/01/2023)   B1300 Health Literacy    Frequency of need for help with medical instructions: Never    Family History  Problem Relation Age of Onset   Colon cancer Mother    Heart disease Father      ROS: A complete ROS was performed with pertinent positives/negatives noted in the HPI. The remainder of the ROS are negative.    OBJECTIVE:  VITALS per patient if applicable: Today's Vitals   04/22/24 1006  Weight: 230 lb (104.3 kg)  Height: 5' 7 (1.702 m)   Body mass index is 36.02 kg/m.   GENERAL: Alert and oriented. Appears well and in no acute  distress. HEENT: Atraumatic. Conjunctiva clear. No obvious abnormalities on inspection of external nose and ears. NECK: Normal movements of the head and neck. LUNGS: On inspection, no signs of respiratory distress. Breathing rate appears normal. No obvious gross SOB, gasping or wheezing, and no conversational dyspnea. CV: No obvious cyanosis. MS: Moves all visible extremities without noticeable abnormality. PSYCH/NEURO: Pleasant and cooperative. No obvious depression or anxiety. Speech and thought processing grossly intact.  ASSESSMENT AND PLAN: 1. Polycythemia (Primary) Discussed cause of polycythemia due to uncontrolled OSA with patient. Will recheck CBC and pt will contact [facility] to discuss options for OSA management.  - CBC with Differential/Platelet  2. Class 2 severe obesity due to excess calories with serious comorbidity in adult, unspecified BMI Discussed diet related concerns with comorbidities of HLD, OSA. Will check CMP and A1C with lab work.  - Comprehensive metabolic panel with GFR - Hemoglobin A1c  3. Moderate obstructive sleep apnea in adult Uncontrolled. No longer taking Glp. Will continue with managemnet by [facility] and pt will reach out to discuss options.   4. Vitamin D  deficiency Will recheck Vitamin D  with labs and provide supplementation pending result.  - VITAMIN D  25 Hydroxy (Vit-D Deficiency, Fractures)  5. Moderate mixed hyperlipidemia not requiring statin therapy Previously improved with statin therapy. Recommend continuing statin therapy and will titrate pending results.  - Lipid panel    I discussed the assessment and treatment plan with the patient. The patient was provided an opportunity to ask questions and all were answered. The patient agreed with the plan and demonstrated an understanding of the instructions.   The patient was advised to call back or seek an in-person evaluation if the symptoms worsen or if the condition fails to improve as anticipated.  I  provided 8 minutes of non-face-to-face time during this encounter.  Return in about 3 months (around 07/20/2024) for ANNUAL PHYSICAL.  Thersia Schuyler Stark, FNP      [1]  Current Outpatient Medications:    aspirin 81 MG chewable tablet, Chew 81 mg by mouth daily. Takes rarely, Disp: , Rfl:    atorvastatin  (LIPITOR) 20 MG tablet, TAKE 1 TABLET BY MOUTH DAILY, Disp: 90 tablet, Rfl: 3   hydrochlorothiazide  (MICROZIDE ) 12.5 MG capsule, TAKE 1 CAPSULE BY MOUTH 2 TIMES A DAY, Disp: 180 capsule, Rfl: 2   propranolol  (INDERAL ) 10 MG tablet, Take 1 tablet (10 mg total) by mouth 2 (two) times daily., Disp: 180 tablet, Rfl: 1   tadalafil  (CIALIS ) 10 MG tablet, Take 1 tablet (10 mg total) by mouth every other day as needed for erectile dysfunction. Do not take more than once daily., Disp: 10 tablet, Rfl: 1   valsartan  (DIOVAN ) 80 MG tablet, Take 1 tablet (80 mg total) by mouth 2 (two) times daily., Disp: 180 tablet, Rfl: 3   doxycycline  (VIBRA -TABS) 100 MG tablet, Take 1 tablet (100 mg total) by mouth 2 (two) times daily., Disp: 14 tablet, Rfl: 0 [2]  Allergies Allergen Reactions   Codeine Other (See Comments)    unknown unknown   Claritin [Loratadine] Anxiety   "

## 2024-04-22 NOTE — Patient Instructions (Signed)
 Please return for fasting blood work.  For fasting, if your blood work is in the morning please do not eat any food after midnight.  You may have water or black coffee prior to your lab work.  Please take all regularly prescribed medications even if you are fasting.  If your blood work is in the afternoon, please fast for at least 5 to 6 hours.  You may continue to drink water and/or black coffee prior to your lab work.  Please take all scheduled medications even if you are fasting.   Ask for labs to be drawn for Dr. Sheena and Knute, NP.
